# Patient Record
Sex: Female | Born: 1995 | Race: Black or African American | Hispanic: No | Marital: Married | Smoking: Never smoker
Health system: Southern US, Community
[De-identification: ages and names within clinical notes are randomized; demographics above are authoritative.]

## PROBLEM LIST (undated history)

## (undated) ENCOUNTER — Ambulatory Visit (HOSPITAL_COMMUNITY): Payer: Self-pay | Source: Home / Self Care

## (undated) DIAGNOSIS — Z789 Other specified health status: Secondary | ICD-10-CM

## (undated) DIAGNOSIS — I10 Essential (primary) hypertension: Secondary | ICD-10-CM

## (undated) DIAGNOSIS — O139 Gestational [pregnancy-induced] hypertension without significant proteinuria, unspecified trimester: Secondary | ICD-10-CM

## (undated) HISTORY — PX: LEFT OOPHORECTOMY: SHX1961

## (undated) HISTORY — DX: Essential (primary) hypertension: I10

## (undated) HISTORY — DX: Other specified health status: Z78.9

## (undated) HISTORY — DX: Gestational (pregnancy-induced) hypertension without significant proteinuria, unspecified trimester: O13.9

## (undated) HISTORY — PX: SALPINGECTOMY: SHX328

---
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
## 2015-12-10 ENCOUNTER — Emergency Department (HOSPITAL_COMMUNITY)
Admission: EM | Admit: 2015-12-10 | Discharge: 2015-12-10 | Disposition: A | Discharge status: Home / Self Care | Payer: Self-pay | Attending: Emergency Medicine | Admitting: Emergency Medicine

## 2015-12-10 ENCOUNTER — Emergency Department (HOSPITAL_COMMUNITY): Payer: Self-pay

## 2015-12-10 ENCOUNTER — Encounter (HOSPITAL_COMMUNITY): Payer: Self-pay

## 2015-12-10 DIAGNOSIS — R0789 Other chest pain: Secondary | ICD-10-CM | POA: Insufficient documentation

## 2015-12-10 IMAGING — CR DG CHEST 2V
2 series · 2 of 2 positions shown · non-contrast
Comparison: None in PACs

CLINICAL DATA: Mid 0 right-sided chart chest discomfort
intermittently for the past several months

EXAM:
CHEST  2 VIEW

[chest pa]
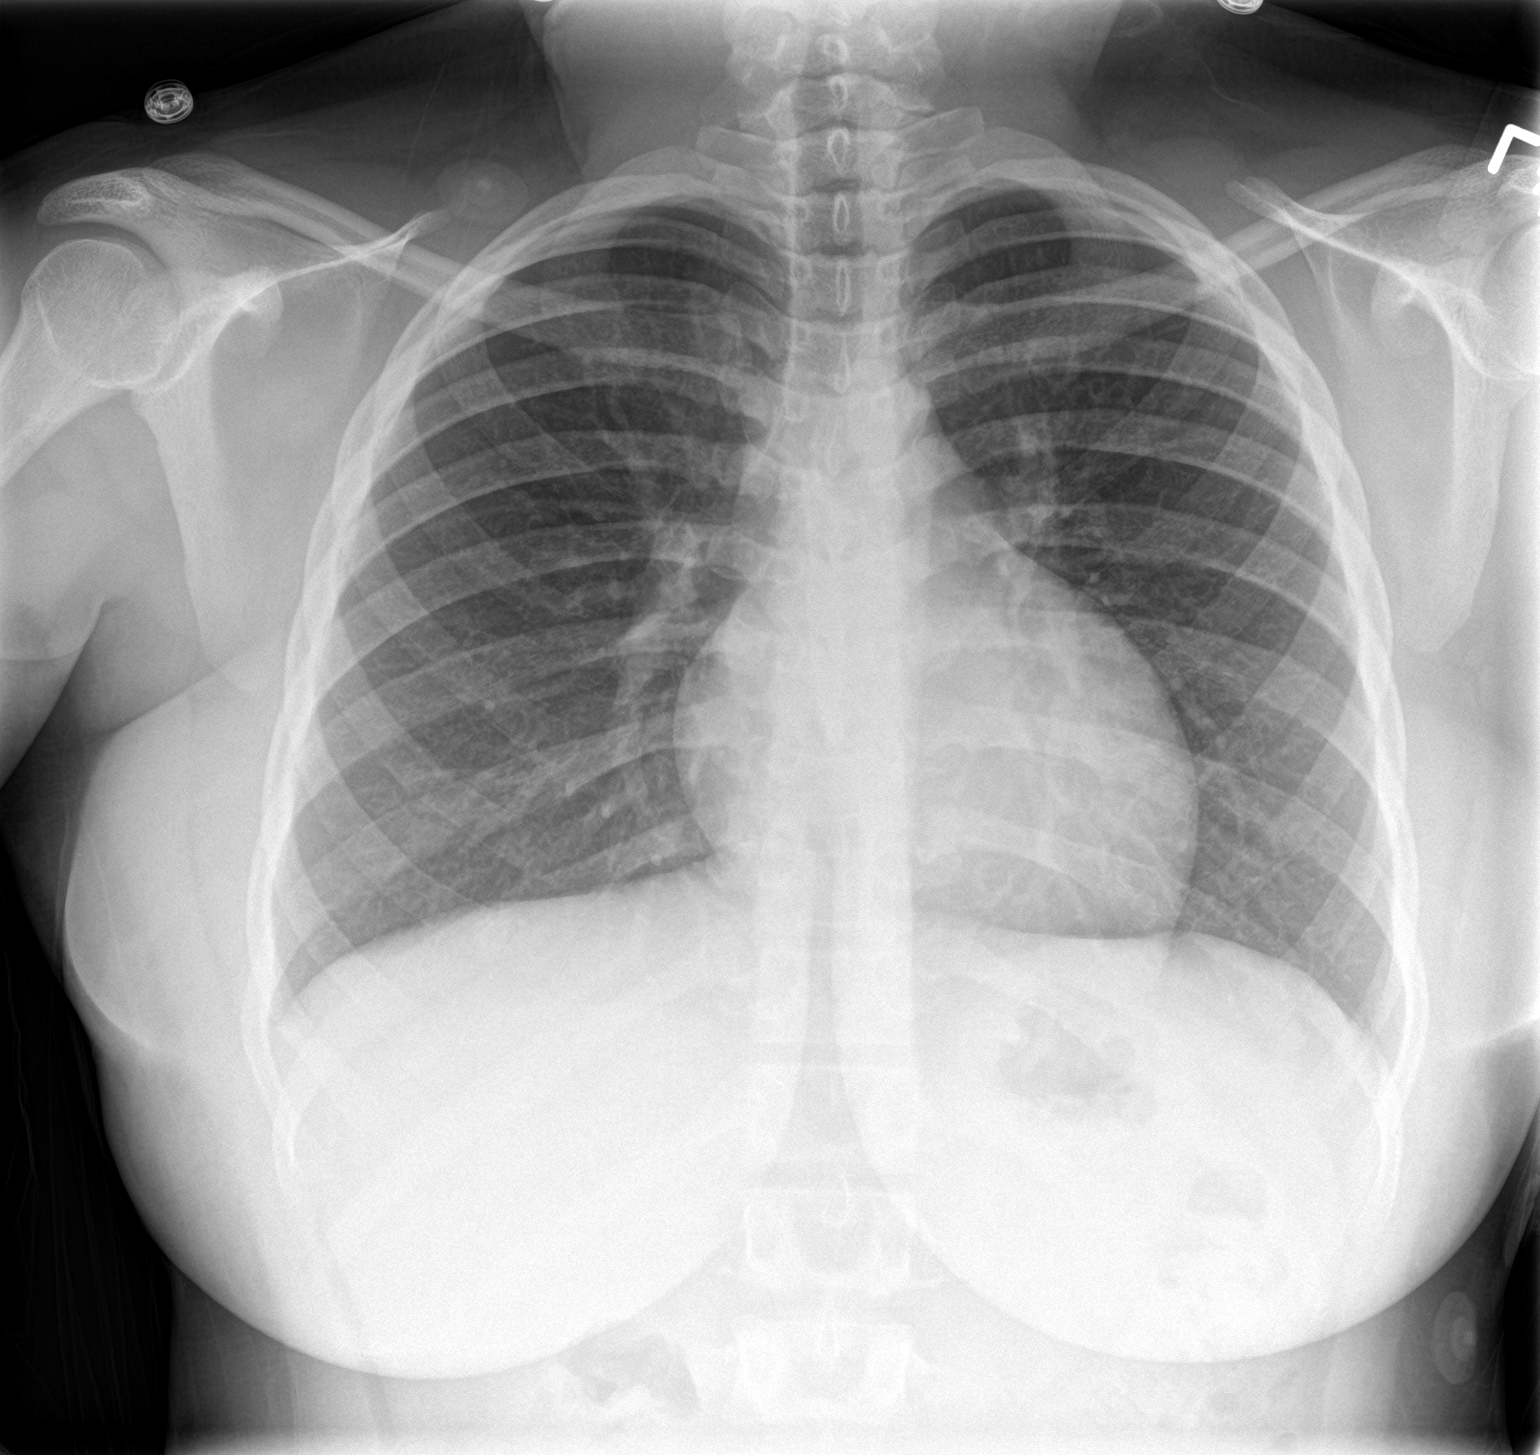

[chest lat]
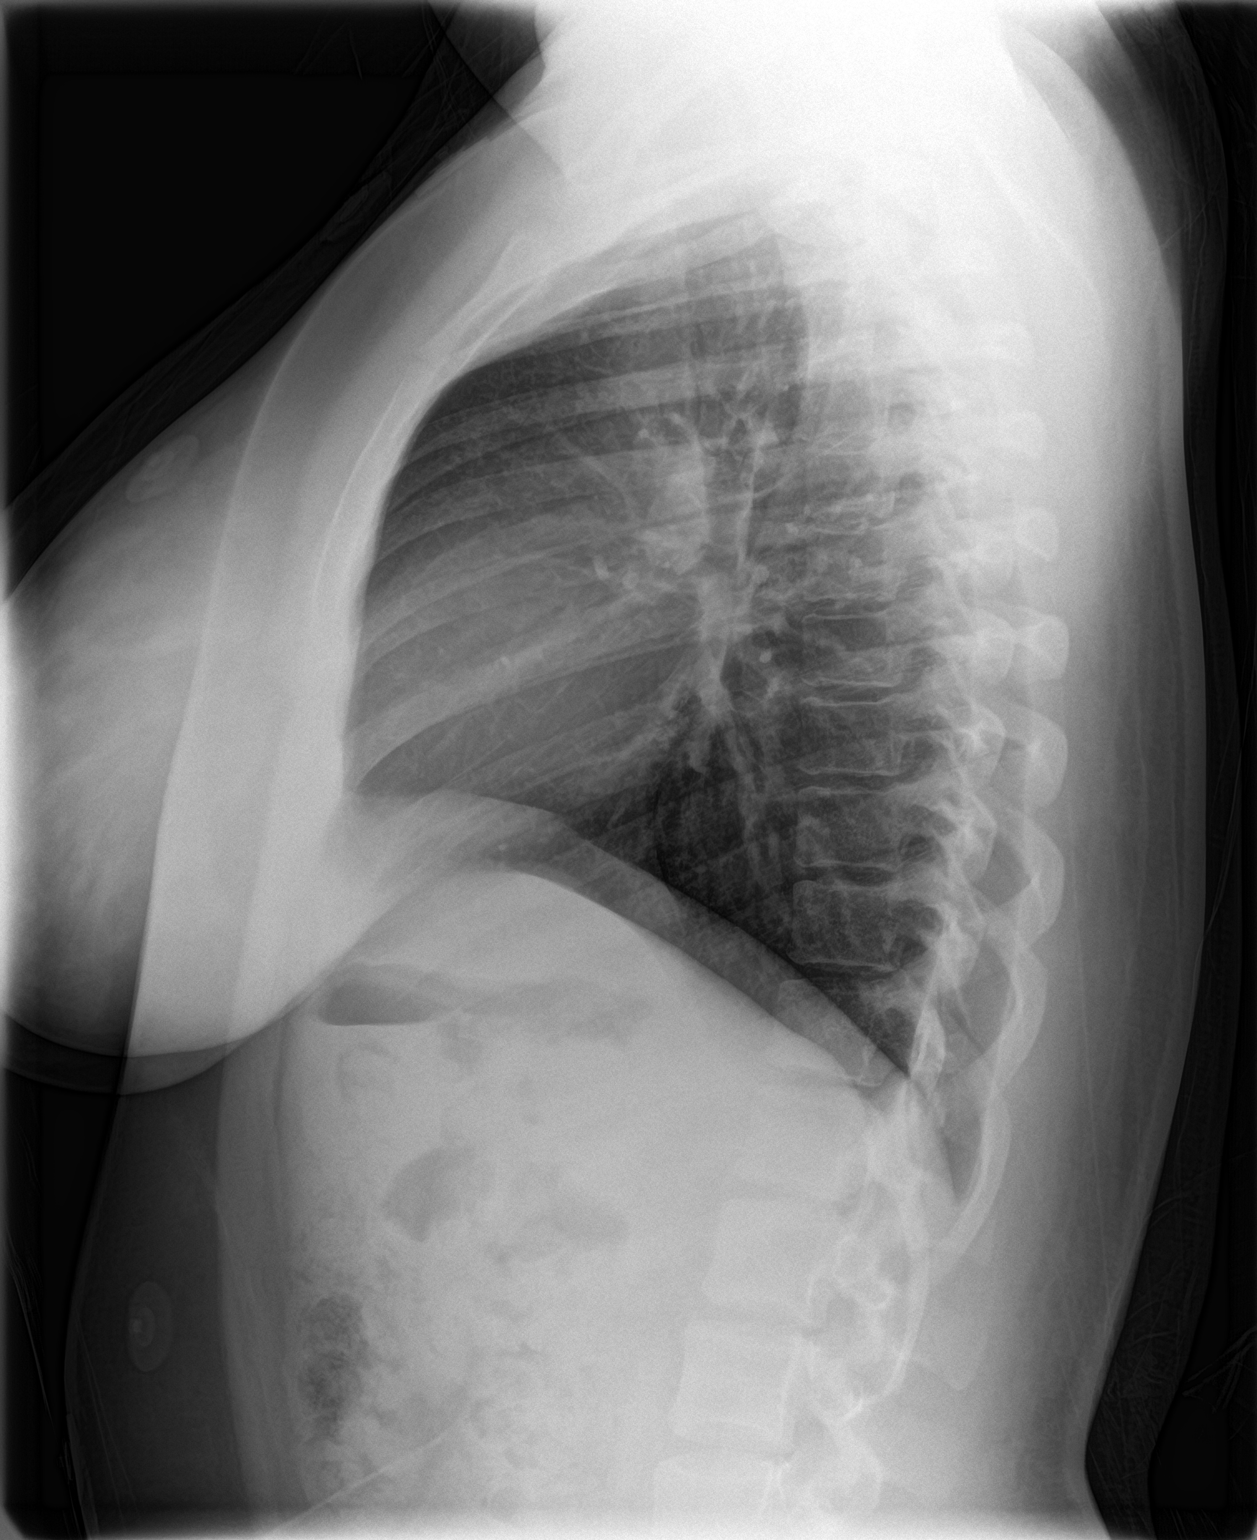

[2 of 2 positions shown; findings below may reference images not displayed]

FINDINGS: The lungs are adequately inflated and clear. The heart and pulmonary
vascularity are normal. The mediastinum is normal in width. The
trachea is midline. The bony thorax exhibits no acute abnormality.
IMPRESSION: There is no active cardiopulmonary disease. No chest wall
abnormality is observed.

## 2015-12-10 NOTE — ED Notes (Signed)
Pt. Presents with complaint of R sided CP starting " a few months ago." pt. Denies associated symptoms. Pt. States she has noticed pain worsening over past few weeks which prompted her to get evaluated. Pt. Ambulatory, in NAD. AxO x4.

## 2015-12-10 NOTE — ED Provider Notes (Signed)
CSN: [id number]     Arrival date & time 12/10/15  0907 History   First MD Initiated Contact with Patient 12/10/15 [phone]     Chief Complaint  Patient presents with  . Chest Pain     (Consider location/radiation/quality/duration/timing/severity/associated sxs/prior Treatment) HPI Comments: Patient here complaining of several months of right-sided chest pain characterized as sharp and lasting for several minutes without associated diaphoresis or dyspnea. She denies any cough or congestion. No recent weight loss. Does not take birth control pills. No leg pain or swelling. No association with food. Symptoms have been waxing and waning and are spontaneous. No associated palpitations. No history of rashes. Has not been seen by a provider for this.  Patient is a 20 y.o. female presenting with chest pain. The history is provided by the patient.  Chest Pain   History reviewed. No pertinent past medical history. History reviewed. No pertinent past surgical history. No family history on file. Social History  Substance Use Topics  . Smoking status: Never Smoker   . Smokeless tobacco: None  . Alcohol Use: No   OB History    No data available     Review of Systems  Cardiovascular: Positive for chest pain.  All other systems reviewed and are negative.     Allergies  Review of patient's allergies indicates no known allergies.  Home Medications   Prior to Admission medications   Medication Sig Start Date End Date Taking? Authorizing Provider  ibuprofen (ADVIL,MOTRIN) 200 MG tablet Take 400 mg by mouth every 6 (six) hours as needed (pain).   Yes Historical Provider, MD   BP 123/75 mmHg  Pulse 87  Temp(Src) 98.7 F (37.1 C) (Oral)  Resp 17  SpO2 98%  LMP 11/29/2015 Physical Exam  Constitutional: She is oriented to person, place, and time. She appears well-developed and well-nourished.  Non-toxic appearance. No distress.  HENT:  Head: Normocephalic and atraumatic.  Eyes:  Conjunctivae, EOM and lids are normal. Pupils are equal, round, and reactive to light.  Neck: Normal range of motion. Neck supple. No tracheal deviation present. No thyroid mass present.  Cardiovascular: Normal rate, regular rhythm and normal heart sounds.  Exam reveals no gallop.   No murmur heard. Pulmonary/Chest: Effort normal and breath sounds normal. No stridor. No respiratory distress. She has no decreased breath sounds. She has no wheezes. She has no rhonchi. She has no rales.  Abdominal: Soft. Normal appearance and bowel sounds are normal. She exhibits no distension. There is no tenderness. There is no rebound and no CVA tenderness.  Musculoskeletal: Normal range of motion. She exhibits no edema or tenderness.  Neurological: She is alert and oriented to person, place, and time. She has normal strength. No cranial nerve deficit or sensory deficit. GCS eye subscore is 4. GCS verbal subscore is 5. GCS motor subscore is 6.  Skin: Skin is warm and dry. No abrasion and no rash noted.  Psychiatric: She has a normal mood and affect. Her speech is normal and behavior is normal.  Nursing note and vitals reviewed.   ED Course  Procedures (including critical care time) Labs Review Labs Reviewed  Rosezena Sensor, ED    Imaging Review No results found. I have personally reviewed and evaluated these images and lab results as part of my medical decision-making.   EKG Interpretation   Date/Time:  Tuesday Dec 10 2015 09:18:29 EDT Ventricular Rate:  87 PR Interval:  161 QRS Duration: 78 QT Interval:  363 QTC Calculation: 437 R Axis:  40 Text Interpretation:  Sinus arrhythmia Ventricular premature complex Low  voltage, precordial leads Baseline wander in lead(s) V6 Confirmed by Lashondra Vaquerano   MD, Jayant Kriz ([id number]) on 12/10/2015 9:34:59 AM      MDM   Final diagnoses:  None    Chest x-rays reassuring. No concern here for PE or ACS. She has had symptoms for several months. Encouraged to  follow-up with her doctor.    Lorre NickAnthony Cavon Nicolls, MD 12/10/15 [phone]

## 2015-12-10 NOTE — Discharge Instructions (Signed)
Nonspecific Chest Pain  °Chest pain can be caused by many different conditions. There is always a chance that your pain could be related to something serious, such as a heart attack or a blood clot in your lungs. Chest pain can also be caused by conditions that are not life-threatening. If you have chest pain, it is very important to follow up with your health care provider. °CAUSES  °Chest pain can be caused by: °· Heartburn. °· Pneumonia or bronchitis. °· Anxiety or stress. °· Inflammation around your heart (pericarditis) or lung (pleuritis or pleurisy). °· A blood clot in your lung. °· A collapsed lung (pneumothorax). It can develop suddenly on its own (spontaneous pneumothorax) or from trauma to the chest. °· Shingles infection (varicella-zoster virus). °· Heart attack. °· Damage to the bones, muscles, and cartilage that make up your chest wall. This can include: °¨ Bruised bones due to injury. °¨ Strained muscles or cartilage due to frequent or repeated coughing or overwork. °¨ Fracture to one or more ribs. °¨ Sore cartilage due to inflammation (costochondritis). °RISK FACTORS  °Risk factors for chest pain may include: °· Activities that increase your risk for trauma or injury to your chest. °· Respiratory infections or conditions that cause frequent coughing. °· Medical conditions or overeating that can cause heartburn. °· Heart disease or family history of heart disease. °· Conditions or health behaviors that increase your risk of developing a blood clot. °· Having had chicken pox (varicella zoster). °SIGNS AND SYMPTOMS °Chest pain can feel like: °· Burning or tingling on the surface of your chest or deep in your chest. °· Crushing, pressure, aching, or squeezing pain. °· Dull or sharp pain that is worse when you move, cough, or take a deep breath. °· Pain that is also felt in your back, neck, shoulder, or arm, or pain that spreads to any of these areas. °Your chest pain may come and go, or it may stay  constant. °DIAGNOSIS °Lab tests or other studies may be needed to find the cause of your pain. Your health care provider may have you take a test called an ambulatory ECG (electrocardiogram). An ECG records your heartbeat patterns at the time the test is performed. You may also have other tests, such as: °· Transthoracic echocardiogram (TTE). During echocardiography, sound waves are used to create a picture of all of the heart structures and to look at how blood flows through your heart. °· Transesophageal echocardiogram (TEE). This is a more advanced imaging test that obtains images from inside your body. It allows your health care provider to see your heart in finer detail. °· Cardiac monitoring. This allows your health care provider to monitor your heart rate and rhythm in real time. °· Holter monitor. This is a portable device that records your heartbeat and can help to diagnose abnormal heartbeats. It allows your health care provider to track your heart activity for several days, if needed. °· Stress tests. These can be done through exercise or by taking medicine that makes your heart beat more quickly. °· Blood tests. °· Imaging tests. °TREATMENT  °Your treatment depends on what is causing your chest pain. Treatment may include: °· Medicines. These may include: °¨ Acid blockers for heartburn. °¨ Anti-inflammatory medicine. °¨ Pain medicine for inflammatory conditions. °¨ Antibiotic medicine, if an infection is present. °¨ Medicines to dissolve blood clots. °¨ Medicines to treat coronary artery disease. °· Supportive care for conditions that do not require medicines. This may include: °¨ Resting. °¨ Applying heat   or cold packs to injured areas. °¨ Limiting activities until pain decreases. °HOME CARE INSTRUCTIONS °· If you were prescribed an antibiotic medicine, finish it all even if you start to feel better. °· Avoid any activities that bring on chest pain. °· Do not use any tobacco products, including  cigarettes, chewing tobacco, or electronic cigarettes. If you need help quitting, ask your health care provider. °· Do not drink alcohol. °· Take medicines only as directed by your health care provider. °· Keep all follow-up visits as directed by your health care provider. This is important. This includes any further testing if your chest pain does not go away. °· If heartburn is the cause for your chest pain, you may be told to keep your head raised (elevated) while sleeping. This reduces the chance that acid will go from your stomach into your esophagus. °· Make lifestyle changes as directed by your health care provider. These may include: °¨ Getting regular exercise. Ask your health care provider to suggest some activities that are safe for you. °¨ Eating a heart-healthy diet. A registered dietitian can help you to learn healthy eating options. °¨ Maintaining a healthy weight. °¨ Managing diabetes, if necessary. °¨ Reducing stress. °SEEK MEDICAL CARE IF: °· Your chest pain does not go away after treatment. °· You have a rash with blisters on your chest. °· You have a fever. °SEEK IMMEDIATE MEDICAL CARE IF:  °· Your chest pain is worse. °· You have an increasing cough, or you cough up blood. °· You have severe abdominal pain. °· You have severe weakness. °· You faint. °· You have chills. °· You have sudden, unexplained chest discomfort. °· You have sudden, unexplained discomfort in your arms, back, neck, or jaw. °· You have shortness of breath at any time. °· You suddenly start to sweat, or your skin gets clammy. °· You feel nauseous or you vomit. °· You suddenly feel light-headed or dizzy. °· Your heart begins to beat quickly, or it feels like it is skipping beats. °These symptoms may represent a serious problem that is an emergency. Do not wait to see if the symptoms will go away. Get medical help right away. Call your local emergency services (911 in the [place] Do not drive yourself to the hospital. °  °This  information is not intended to replace advice given to you by your health care provider. Make sure you discuss any questions you have with your health care provider. °  °Document Released: 04/15/2005 Document Revised: 07/27/2014 Document Reviewed: 02/09/2014 °Elsevier Interactive Patient Education ©2016 [employer] ° °

## 2018-11-10 LAB — OB RESULTS CONSOLE GC/CHLAMYDIA
Chlamydia: NEGATIVE
Gonorrhea: NEGATIVE

## 2018-11-10 LAB — OB RESULTS CONSOLE RUBELLA ANTIBODY, IGM: Rubella: IMMUNE

## 2018-11-10 LAB — OB RESULTS CONSOLE RPR: RPR: NONREACTIVE

## 2018-11-10 LAB — OB RESULTS CONSOLE HEPATITIS B SURFACE ANTIGEN: Hepatitis B Surface Ag: NEGATIVE

## 2018-11-10 LAB — OB RESULTS CONSOLE HIV ANTIBODY (ROUTINE TESTING): HIV: NONREACTIVE

## 2018-11-11 ENCOUNTER — Other Ambulatory Visit (HOSPITAL_COMMUNITY): Payer: Self-pay | Admitting: Nurse Practitioner

## 2018-11-11 DIAGNOSIS — Z369 Encounter for antenatal screening, unspecified: Secondary | ICD-10-CM

## 2018-11-11 DIAGNOSIS — Z3A13 13 weeks gestation of pregnancy: Secondary | ICD-10-CM

## 2018-11-18 ENCOUNTER — Encounter (HOSPITAL_COMMUNITY): Payer: Self-pay | Admitting: *Deleted

## 2018-11-23 ENCOUNTER — Ambulatory Visit (HOSPITAL_COMMUNITY): Payer: Medicaid Other

## 2018-11-23 ENCOUNTER — Other Ambulatory Visit: Payer: Self-pay

## 2018-11-23 ENCOUNTER — Ambulatory Visit (HOSPITAL_COMMUNITY)
Admission: RE | Admit: 2018-11-23 | Discharge: 2018-11-23 | Disposition: A | Discharge status: Home / Self Care | Payer: Medicaid Other | Source: Ambulatory Visit | Attending: Obstetrics and Gynecology | Admitting: Obstetrics and Gynecology

## 2018-11-23 ENCOUNTER — Encounter (HOSPITAL_COMMUNITY): Payer: Self-pay

## 2018-11-23 ENCOUNTER — Ambulatory Visit (HOSPITAL_COMMUNITY): Payer: Medicaid Other | Admitting: *Deleted

## 2018-11-23 VITALS — Temp 99.0°F | Ht <= 58 in | Wt 158.0 lb

## 2018-11-23 DIAGNOSIS — Z3682 Encounter for antenatal screening for nuchal translucency: Secondary | ICD-10-CM | POA: Diagnosis not present

## 2018-11-23 DIAGNOSIS — Z369 Encounter for antenatal screening, unspecified: Secondary | ICD-10-CM

## 2018-11-23 DIAGNOSIS — Z3A13 13 weeks gestation of pregnancy: Secondary | ICD-10-CM | POA: Diagnosis not present

## 2018-11-23 IMAGING — US US MFM FETAL NUCHAL TRANSLUCENCY
1 series · 13 of 28 positions shown · non-contrast
Comparison: none

[Series 1: us mfm fetal nuchal translucency · 13 of 31 slices shown]
[im 2/31]
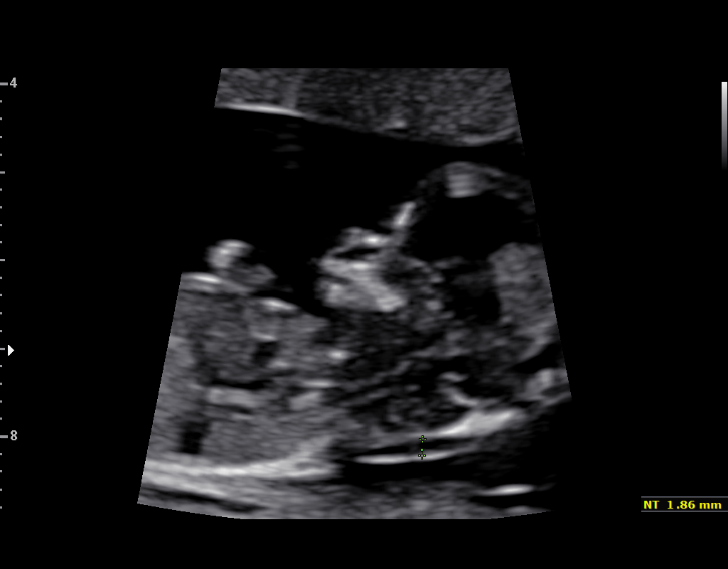
[im 4/31]
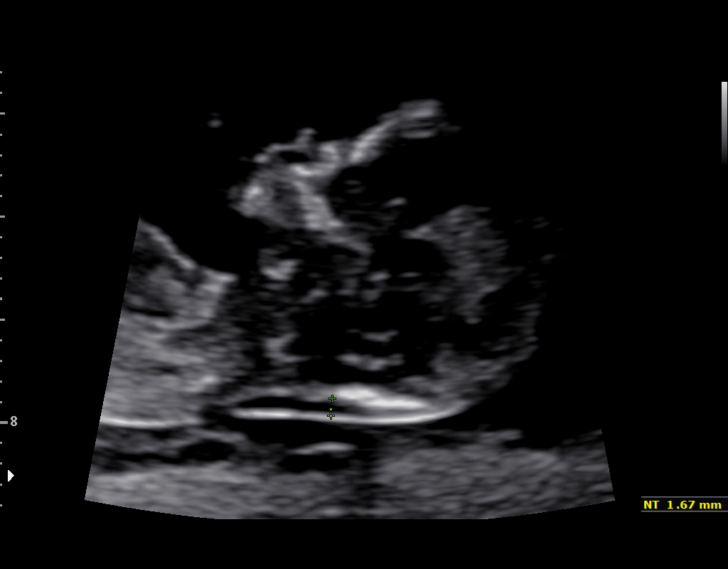
[im 6/31]
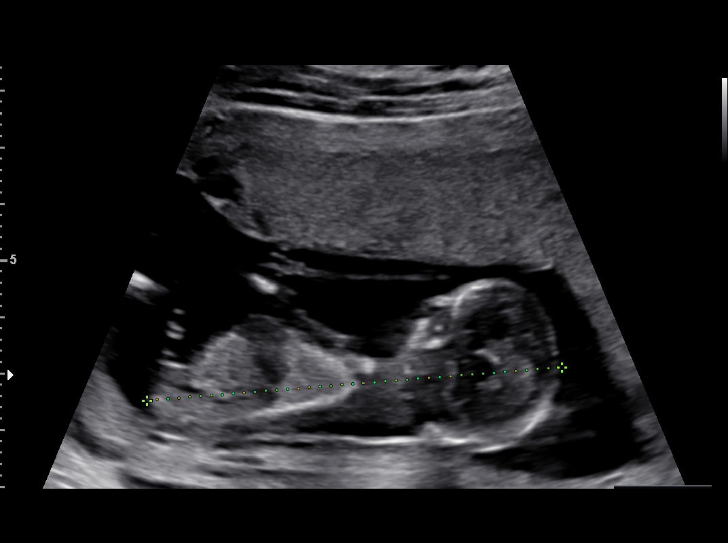
[im 8/31]
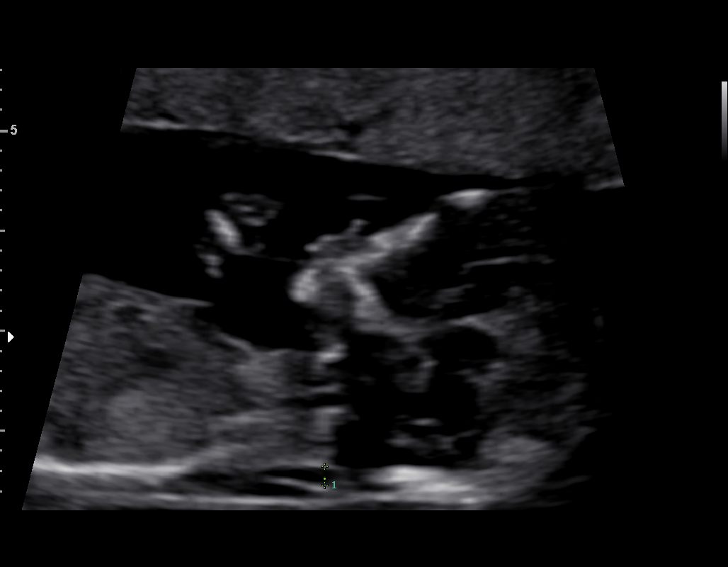
[im 11/31]
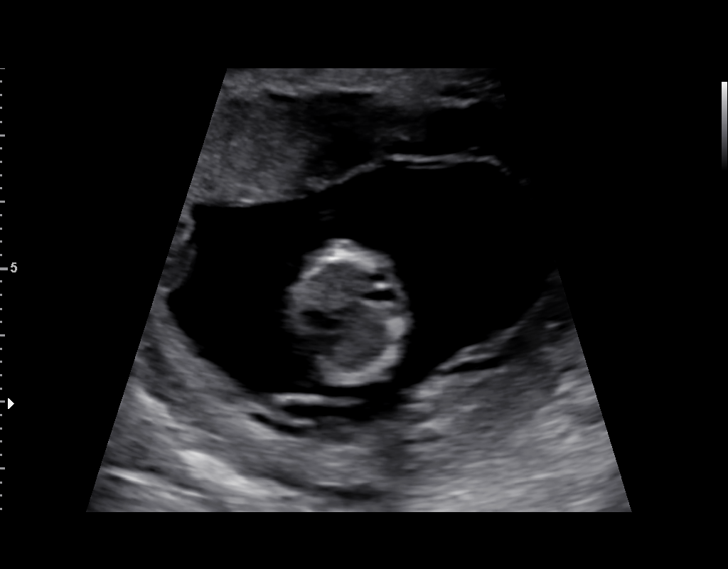
[im 13/31]
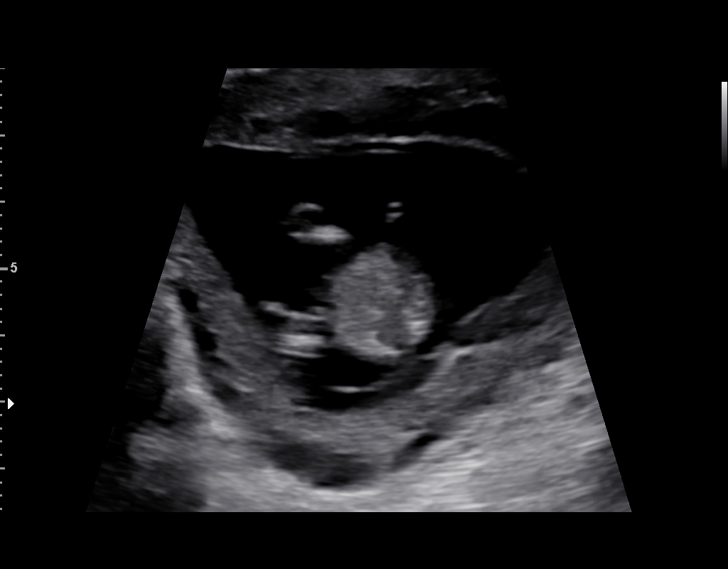
[im 16/31]
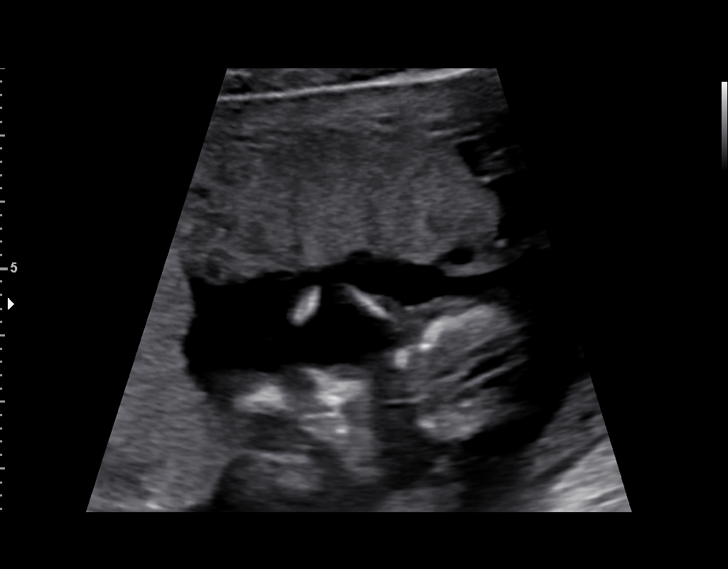
[im 18/31]
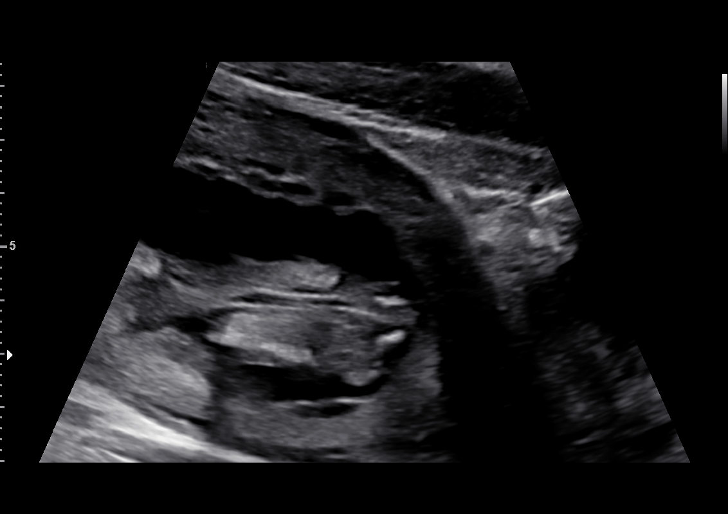
[im 21/31]
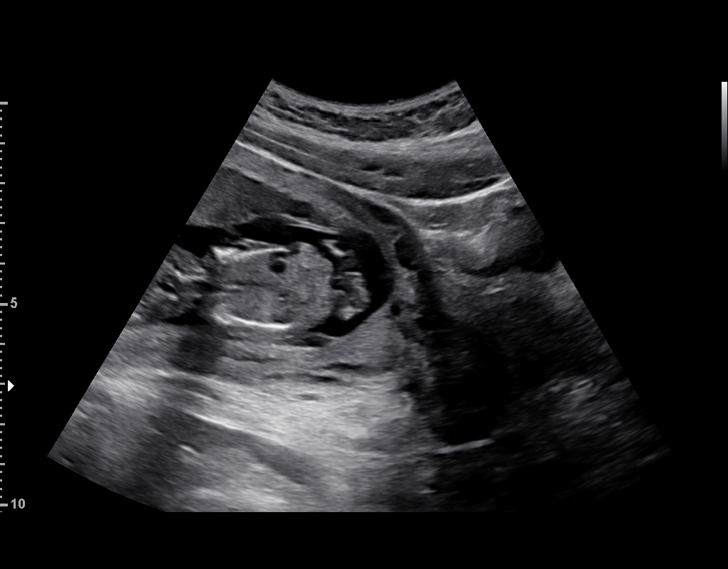
[im 23/31]
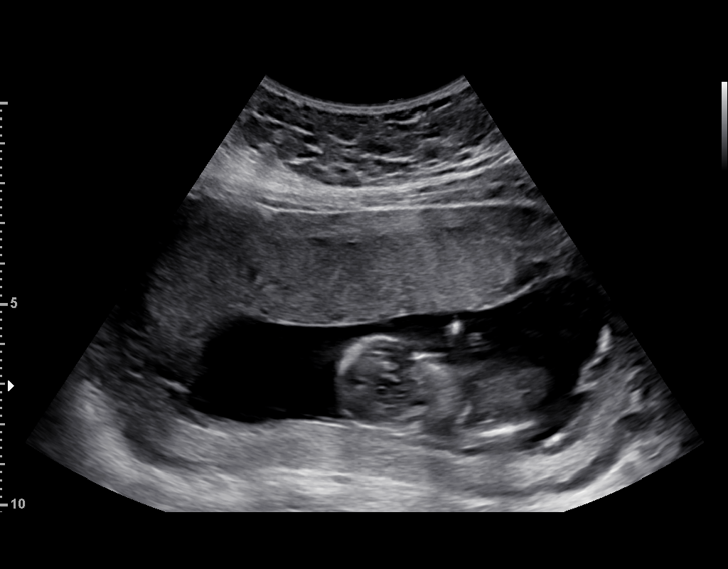
[im 25/31]
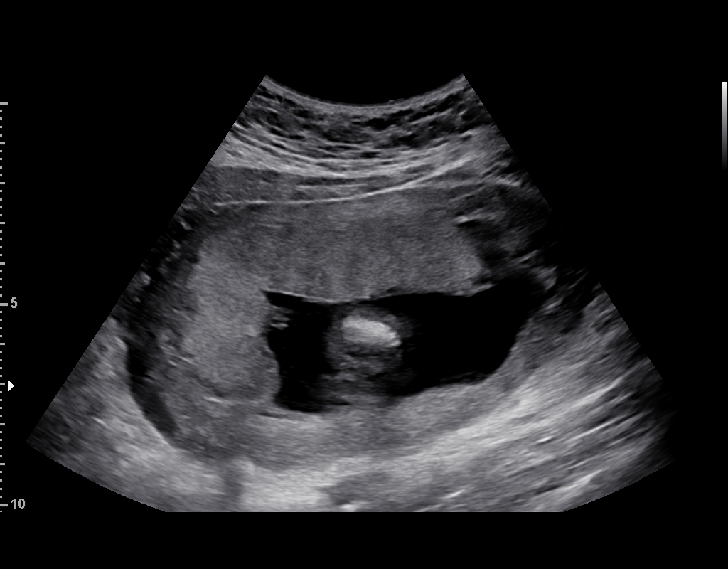
[im 27/31]
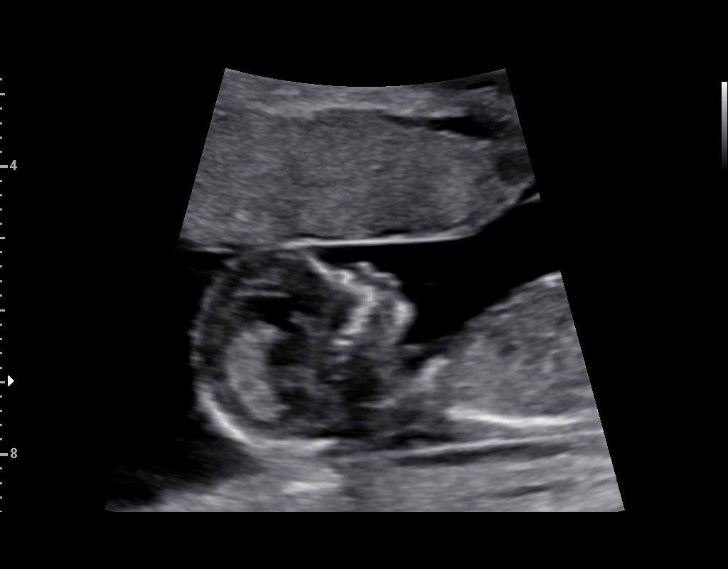
[im 29/31]
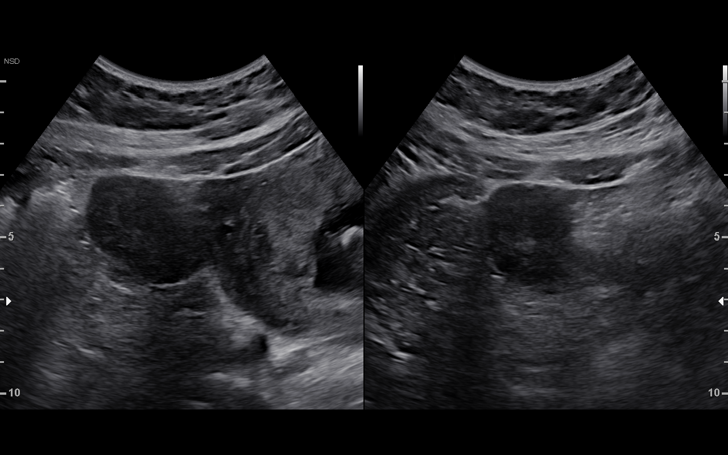

[13 of 28 positions shown; findings below may reference images not displayed]

[REDACTED]-
                                                             Faculty Physician
                   [id number]

     TRANSLUCENCY
 ----------------------------------------------------------------------

 ----------------------------------------------------------------------
Indications

  Encounter for nuchal translucency
  13 weeks gestation of pregnancy
 ----------------------------------------------------------------------
Fetal Evaluation

 Num Of Fetuses:          1
 Fetal Heart Rate(bpm):   167
 Cardiac Activity:        Observed
 Presentation:            Breech
 Placenta:                Anterior

 Amniotic Fluid
 AFI FV:      Within normal limits

                             Largest Pocket(cm)

Biometry

 CRL:      73.4  mm     G. Age:  13w 1d                  EDD:    05/30/19

 NT:        1.9  mm
OB History
 Gravidity:    1         Term:   0        Prem:   0        SAB:   0
 TOP:          0       Ectopic:  0        Living: 0
Gestational Age

 LMP:           13w 1d        Date:  08/23/18                 EDD:   05/30/19
 Best:          13w 1d     Det. By:  LMP  (08/23/18)          EDD:   05/30/19
Anatomy

 Choroid Plexus:        Appears normal         Upper Extremities:      Present
 Stomach:               Appears normal, left   Lower Extremities:      Present
                        sided
 Bladder:               Appears normal
Cervix Uterus Adnexa

 Cervix
 Length:           3.31  cm.
 Normal appearance by transabdominal scan.

 Left Ovary
 Surgically absent.

 Right Ovary
 Within normal limits.
Impression

 On ultrasound, the CRL measurement is consistent with her
 previously-established dates and good fetal heart activity is
 seen. The nuchal translucency (NT) measures
 millimeters, which is normal.  Fetal anatomy that could be
 ascertained at this gestational age is normal.

 Blood was drawn for AINHARA and beta hCG. We will
 communicate first-trimester screening results to the patient.
Recommendations

 Fetal anatomy scan at 20 weeks.
                 [place], [place]

## 2018-11-25 ENCOUNTER — Other Ambulatory Visit (HOSPITAL_COMMUNITY): Payer: Self-pay | Admitting: Nurse Practitioner

## 2018-11-30 ENCOUNTER — Telehealth (HOSPITAL_COMMUNITY): Payer: Self-pay | Admitting: *Deleted

## 2018-11-30 NOTE — Telephone Encounter (Signed)
error 

## 2019-01-09 ENCOUNTER — Encounter (HOSPITAL_COMMUNITY): Payer: Self-pay

## 2019-04-15 ENCOUNTER — Other Ambulatory Visit: Payer: Self-pay

## 2019-04-15 ENCOUNTER — Observation Stay
Admission: EM | Admit: 2019-04-15 | Discharge: 2019-04-15 | Disposition: A | Discharge status: Home / Self Care | Payer: Medicaid Other | Attending: Obstetrics and Gynecology | Admitting: Obstetrics and Gynecology

## 2019-04-15 ENCOUNTER — Encounter: Payer: Self-pay | Admitting: Emergency Medicine

## 2019-04-15 DIAGNOSIS — Z3A33 33 weeks gestation of pregnancy: Secondary | ICD-10-CM | POA: Diagnosis not present

## 2019-04-15 DIAGNOSIS — O9A213 Injury, poisoning and certain other consequences of external causes complicating pregnancy, third trimester: Secondary | ICD-10-CM | POA: Diagnosis not present

## 2019-04-15 DIAGNOSIS — S0081XA Abrasion of other part of head, initial encounter: Secondary | ICD-10-CM | POA: Diagnosis present

## 2019-04-15 LAB — TYPE AND SCREEN
ABO/RH(D): O POS
Antibody Screen: NEGATIVE

## 2019-04-15 MED ORDER — ACETAMINOPHEN 325 MG PO TABS
650.0000 mg | ORAL_TABLET | ORAL | Status: DC | PRN
  Administered 2019-04-15: 650 mg via ORAL
  Filled 2019-04-15: qty 2

## 2019-04-15 MED ORDER — CYCLOBENZAPRINE HCL 10 MG PO TABS: 10.0000 mg | ORAL_TABLET | Freq: Three times a day (TID) | ORAL | 0 refills | Status: DC | PRN
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
## 2019-05-02 LAB — OB RESULTS CONSOLE GBS: GBS: NEGATIVE

## 2019-05-22 ENCOUNTER — Other Ambulatory Visit (HOSPITAL_COMMUNITY): Payer: Self-pay | Admitting: Nurse Practitioner

## 2019-05-22 DIAGNOSIS — Z363 Encounter for antenatal screening for malformations: Secondary | ICD-10-CM

## 2019-05-22 DIAGNOSIS — O365931 Maternal care for other known or suspected poor fetal growth, third trimester, fetus 1: Secondary | ICD-10-CM

## 2019-05-23 ENCOUNTER — Inpatient Hospital Stay (HOSPITAL_COMMUNITY)
Admission: AD | Admit: 2019-05-23 | Discharge: 2019-05-28 | DRG: 786 | Disposition: A | Discharge status: Home / Self Care | Payer: Medicaid Other | Attending: Obstetrics and Gynecology | Admitting: Obstetrics and Gynecology

## 2019-05-23 ENCOUNTER — Other Ambulatory Visit (HOSPITAL_COMMUNITY): Payer: Self-pay | Admitting: Nurse Practitioner

## 2019-05-23 ENCOUNTER — Ambulatory Visit (HOSPITAL_COMMUNITY): Payer: Medicaid Other | Admitting: *Deleted

## 2019-05-23 ENCOUNTER — Encounter (HOSPITAL_COMMUNITY): Payer: Self-pay

## 2019-05-23 ENCOUNTER — Inpatient Hospital Stay (HOSPITAL_COMMUNITY): Admission: AD | Admit: 2019-05-23 | Payer: Medicaid Other | Source: Home / Self Care | Admitting: Family Medicine

## 2019-05-23 ENCOUNTER — Encounter (HOSPITAL_COMMUNITY): Payer: Self-pay | Admitting: *Deleted

## 2019-05-23 ENCOUNTER — Other Ambulatory Visit: Payer: Self-pay

## 2019-05-23 ENCOUNTER — Ambulatory Visit (HOSPITAL_COMMUNITY)
Admission: RE | Admit: 2019-05-23 | Discharge: 2019-05-23 | Disposition: A | Discharge status: Home / Self Care | Payer: Medicaid Other | Source: Ambulatory Visit | Attending: Obstetrics and Gynecology | Admitting: Obstetrics and Gynecology

## 2019-05-23 VITALS — BP 118/70 | HR 88 | Temp 98.8°F

## 2019-05-23 DIAGNOSIS — O9081 Anemia of the puerperium: Secondary | ICD-10-CM | POA: Diagnosis not present

## 2019-05-23 DIAGNOSIS — Z363 Encounter for antenatal screening for malformations: Secondary | ICD-10-CM

## 2019-05-23 DIAGNOSIS — Z3A39 39 weeks gestation of pregnancy: Secondary | ICD-10-CM

## 2019-05-23 DIAGNOSIS — O324XX0 Maternal care for high head at term, not applicable or unspecified: Secondary | ICD-10-CM | POA: Diagnosis not present

## 2019-05-23 DIAGNOSIS — O26843 Uterine size-date discrepancy, third trimester: Secondary | ICD-10-CM

## 2019-05-23 DIAGNOSIS — Z20828 Contact with and (suspected) exposure to other viral communicable diseases: Secondary | ICD-10-CM | POA: Diagnosis present

## 2019-05-23 DIAGNOSIS — O133 Gestational [pregnancy-induced] hypertension without significant proteinuria, third trimester: Secondary | ICD-10-CM

## 2019-05-23 DIAGNOSIS — O365931 Maternal care for other known or suspected poor fetal growth, third trimester, fetus 1: Secondary | ICD-10-CM | POA: Diagnosis present

## 2019-05-23 DIAGNOSIS — D62 Acute posthemorrhagic anemia: Secondary | ICD-10-CM | POA: Diagnosis not present

## 2019-05-23 DIAGNOSIS — O411230 Chorioamnionitis, third trimester, not applicable or unspecified: Secondary | ICD-10-CM | POA: Diagnosis present

## 2019-05-23 DIAGNOSIS — O134 Gestational [pregnancy-induced] hypertension without significant proteinuria, complicating childbirth: Secondary | ICD-10-CM | POA: Diagnosis present

## 2019-05-23 DIAGNOSIS — O99213 Obesity complicating pregnancy, third trimester: Secondary | ICD-10-CM

## 2019-05-23 DIAGNOSIS — O411290 Chorioamnionitis, unspecified trimester, not applicable or unspecified: Secondary | ICD-10-CM | POA: Diagnosis not present

## 2019-05-23 DIAGNOSIS — Z3490 Encounter for supervision of normal pregnancy, unspecified, unspecified trimester: Secondary | ICD-10-CM

## 2019-05-23 DIAGNOSIS — O365930 Maternal care for other known or suspected poor fetal growth, third trimester, not applicable or unspecified: Secondary | ICD-10-CM

## 2019-05-23 DIAGNOSIS — Z98891 History of uterine scar from previous surgery: Secondary | ICD-10-CM

## 2019-05-23 LAB — SARS CORONAVIRUS 2 BY RT PCR (HOSPITAL ORDER, PERFORMED IN ~~LOC~~ HOSPITAL LAB): SARS Coronavirus 2: NEGATIVE

## 2019-05-23 LAB — CBC
HCT: 31.6 % — ABNORMAL LOW (ref 36.0–46.0)
Hemoglobin: 10.2 g/dL — ABNORMAL LOW (ref 12.0–15.0)
MCH: 26.8 pg (ref 26.0–34.0)
MCHC: 32.3 g/dL (ref 30.0–36.0)
MCV: 83.2 fL (ref 80.0–100.0)
Platelets: 299 10*3/uL (ref 150–400)
RBC: 3.8 MIL/uL — ABNORMAL LOW (ref 3.87–5.11)
RDW: 13.7 % (ref 11.5–15.5)
WBC: 6.3 10*3/uL (ref 4.0–10.5)
nRBC: 0 % (ref 0.0–0.2)

## 2019-05-23 LAB — ABO/RH: ABO/RH(D): O POS

## 2019-05-23 IMAGING — US US MFM OB DETAIL+14 WK
1 series · 12 of 28 positions shown · non-contrast
Comparison: none

[Series 1: us mfm ob detail+14 wk · 62 acquisitions, 12 frames shown]
[im 3/62]
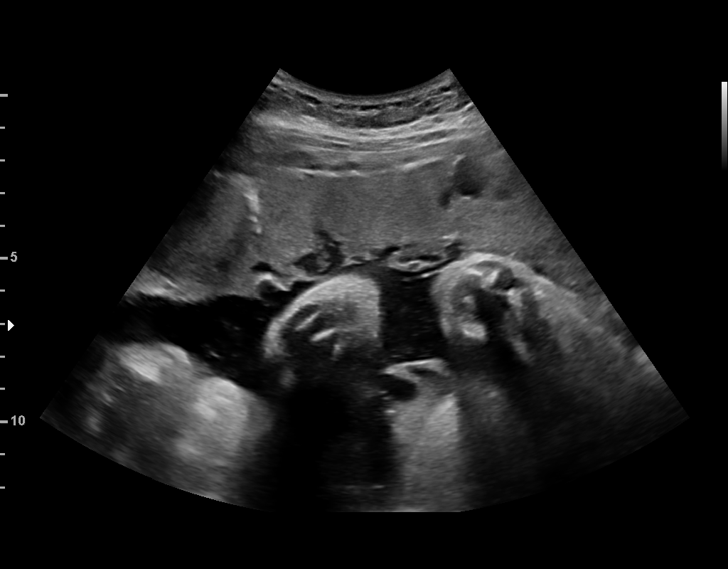
[im 7/62]
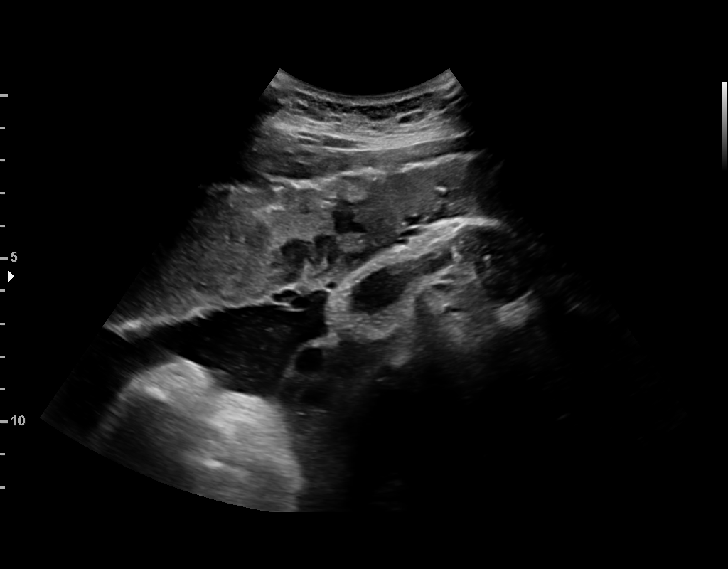
[im 12/62]
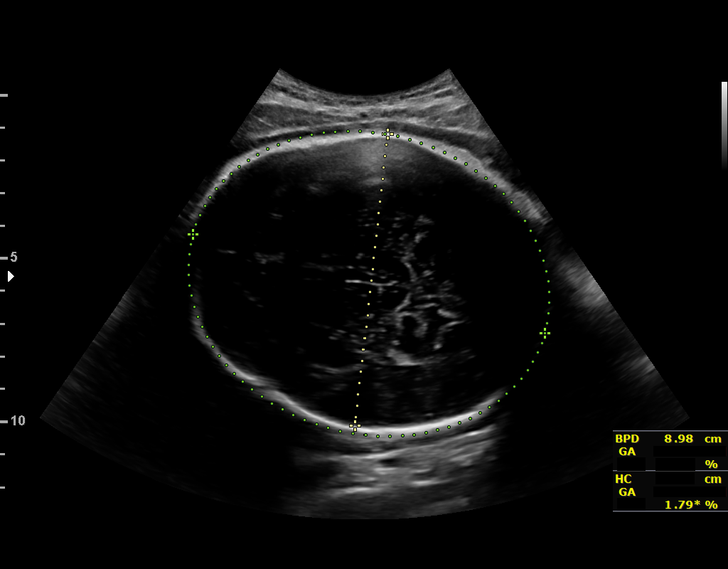
[im 19/62]
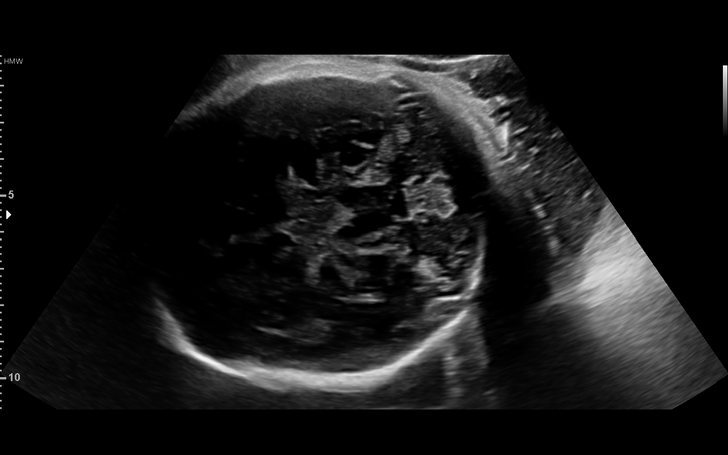
[im 23/62]
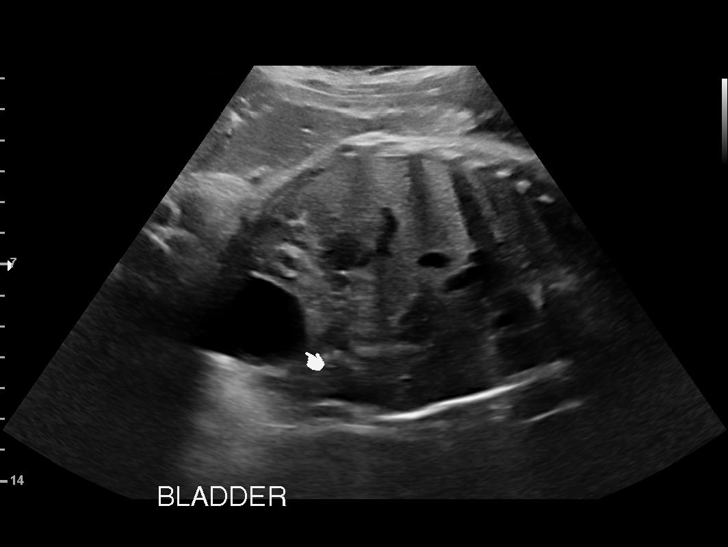
[im 28/62]
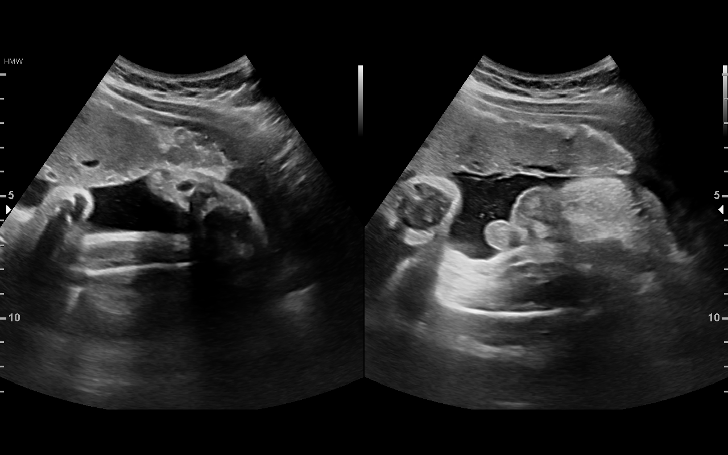
[im 34/62]
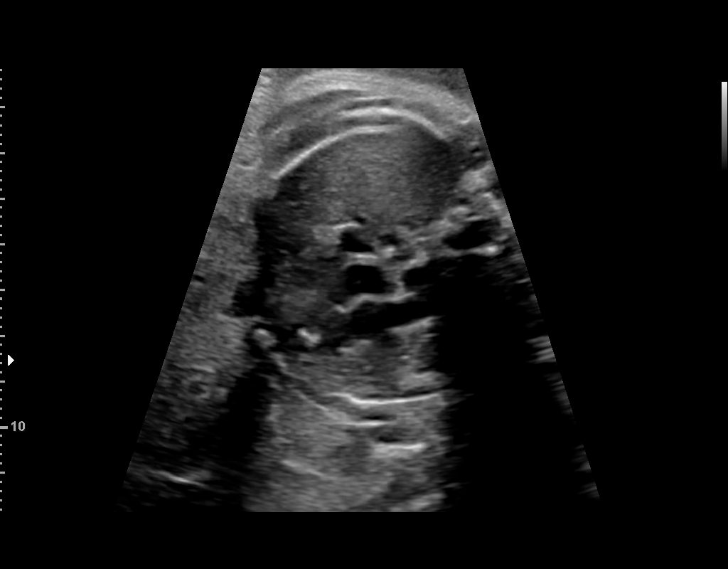
[im 39/62]
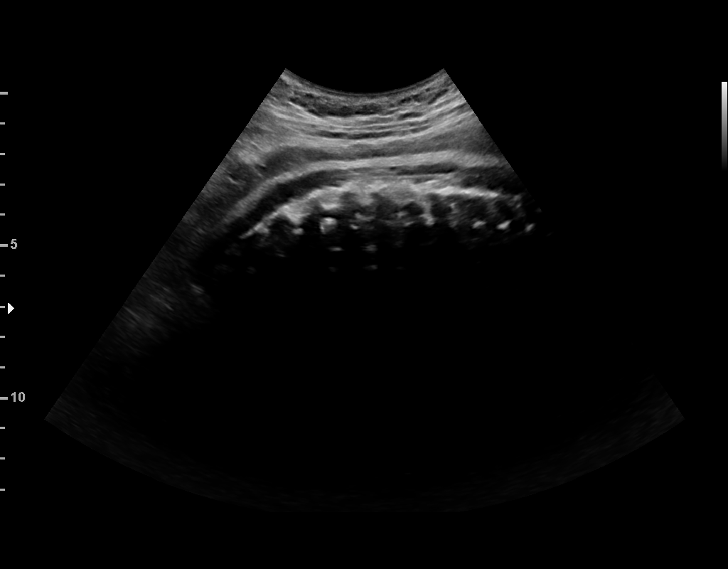
[im 43/62]
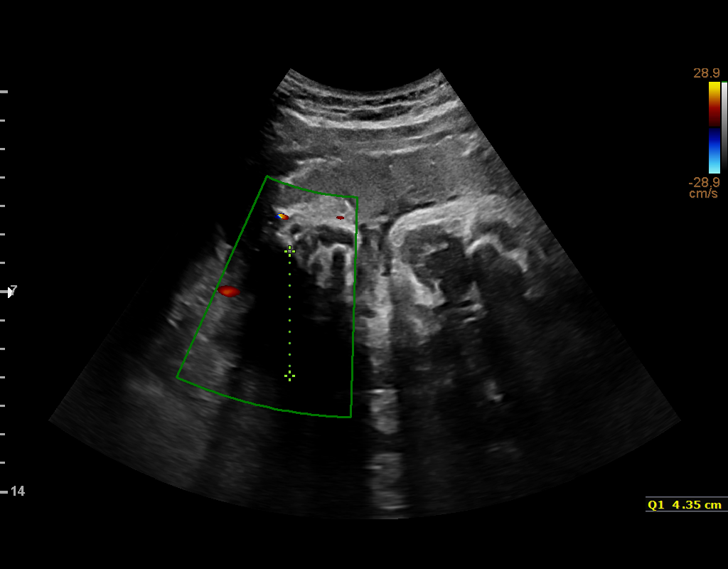
[im 50/62]
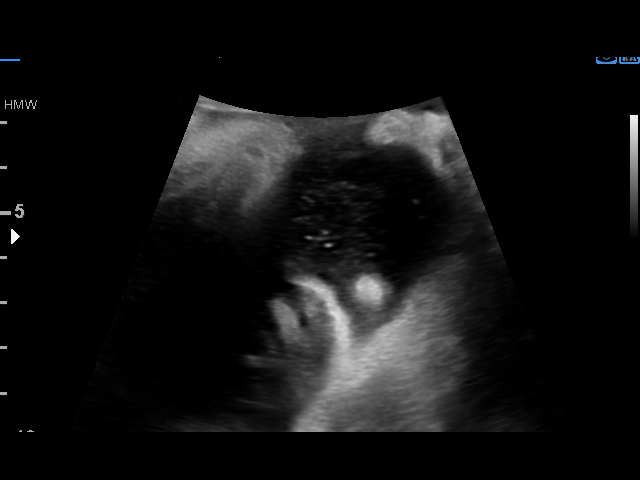
[im 55/62]
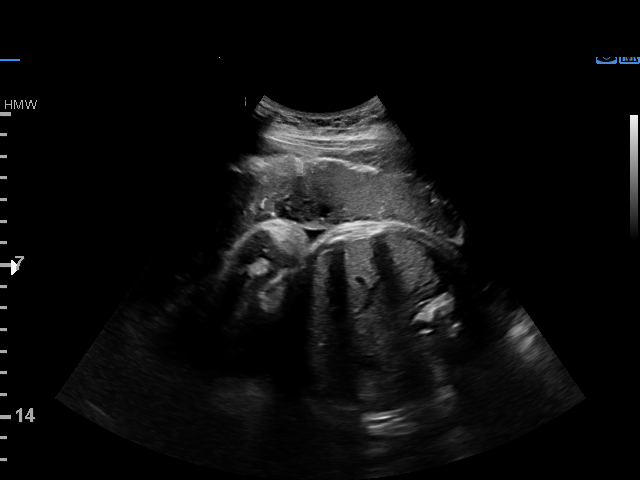
[im 59/62]
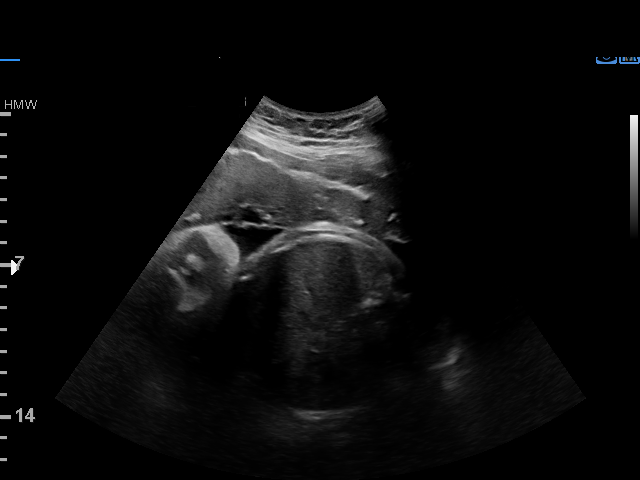

[12 of 28 positions shown; findings below may reference images not displayed]

[REDACTED]-
                                                            Faculty Physician
                   [2T]

     W/NONSTRESS
  3  US MFM UA CORD DOPPLER               76820.02     SOKAINA
 ----------------------------------------------------------------------

 ----------------------------------------------------------------------
Indications

  Maternal care for known or suspected poor      [2T]
  fetal growth, third trimester, not applicable or
  unspecified IUGR
  39 weeks gestation of pregnancy
  Encounter for antenatal screening for          [2T]
  malformations
  Uterine size-date discrepancy, third trimester [2T]
  Obesity complicating pregnancy, third          [2T]
  trimester (pregravid BMI 30.29)
 ----------------------------------------------------------------------
Fetal Evaluation

 Num Of Fetuses:         1
 Cardiac Activity:       Observed
 Presentation:           Cephalic
 Placenta:               Anterior
 P. Cord Insertion:      Visualized, central

 Amniotic Fluid
 AFI FV:      Within normal limits
 AFI Sum(cm)     %Tile       Largest Pocket(cm)
 20.5            85

 RUQ(cm)       RLQ(cm)       LUQ(cm)        LLQ(cm)

Biophysical Evaluation

 Amniotic F.V:   Within normal limits       F. Tone:        Observed
 F. Movement:    Observed                   Score:          [DATE]
 F. Breathing:   Not Observed
Biometry

 BPD:      89.9  mm     G. Age:  36w 3d         16  %    CI:           78   %    70 - 86
                                                         FL/HC:      21.6   %    20.6 -
 HC:      322.1  mm     G. Age:  36w 3d          2  %    HC/AC:      1.03        0.87 -
 AC:      313.2  mm     G. Age:  35w 2d          1  %    FL/BPD:     77.4   %    71 - 87
 FL:       69.6  mm     G. Age:  35w 5d          2  %    FL/AC:      22.2   %    20 - 24
 HUM:      60.9  mm     G. Age:  35w 2d        < 5  %
 CER:      43.1  mm     G. Age:  37w 1d         29  %

 Est. FW:    [2T]  gm           6 lb      5  %
OB History

 Gravidity:    1         Term:   0        Prem:   0        SAB:   0
 TOP:          0       Ectopic:  0        Living: 0
Gestational Age

 LMP:           39w 0d        Date:  [DATE]                 EDD:   [DATE]
 U/S Today:     36w 0d                                        EDD:   [DATE]
 Best:          39w 0d     Det. By:  LMP  ([DATE])          EDD:   [DATE]
Anatomy

 Cranium:               Appears normal         Aortic Arch:            Not well visualized
 Cavum:                 Appears normal         Ductal Arch:            Not well visualized
 Ventricles:            Appears normal         Diaphragm:              Not well visualized
 Choroid Plexus:        Appears normal         Stomach:                Appears normal, left
                                                                       sided
 Cerebellum:            Appears normal         Abdomen:                Appears normal
 Posterior Fossa:       Appears normal         Abdominal Wall:         Not well visualized
 Nuchal Fold:           Not applicable (>20    Cord Vessels:           Appears normal (3
                        wks GA)                                        vessel cord)
 Face:                  Not well visualized    Kidneys:                Not well visualized
 Lips:                  Appears normal         Bladder:                Appears normal
 Thoracic:              Appears normal         Spine:                  Not well visualized
 Heart:                 Appears normal         Upper Extremities:      Visualized
                        (4CH, axis, and
                        situs)
 RVOT:                  Appears normal         Lower Extremities:      Visualized
 LVOT:                  Not well visualized

 Other:  Fetus appears to be a male. Technically difficult due to advanced GA
         and fetal position.
Doppler - Fetal Vessels

 Umbilical Artery
  S/D     %tile     RI              PI                     ADFV    RDFV
 2.45       64   0.59             0.89                         N       N

Cervix Uterus Adnexa

 Cervix
 Not visualized (advanced GA >[2T])

 Uterus
 No abnormality visualized.

 Left Ovary
 No adnexal mass visualized.

 Right Ovary
 No adnexal mass visualized.

 Cul De Sac
 No free fluid seen.

 Adnexa
 No abnormality visualized.
Impression

 Intrauterine growth restriction EFW at the 5th%.
 Biophysical profile [DATE]
 Good fetal movement and amniotic fluid
 I discussed today's examination with Ms. SOKAINA. She reports
 good fetal movement. I recommend delivery given IUGR in
 the context of 39 week delivery.
Recommendations

 To L&D for delivery. Discussed with Dr. SOKAINA.

## 2019-05-23 MED ORDER — ONDANSETRON HCL 4 MG/2ML IJ SOLN
4.0000 mg | Freq: Four times a day (QID) | INTRAMUSCULAR | Status: DC | PRN
  Administered 2019-05-25: 4 mg via INTRAVENOUS

## 2019-05-23 MED ORDER — ACETAMINOPHEN 325 MG PO TABS
650.0000 mg | ORAL_TABLET | ORAL | Status: DC | PRN
  Administered 2019-05-24: 650 mg via ORAL
  Filled 2019-05-23: qty 2

## 2019-05-23 MED ORDER — LACTATED RINGERS IV SOLN
INTRAVENOUS | Status: DC
  Administered 2019-05-25 (×3): via INTRAVENOUS

## 2019-05-23 MED ORDER — MISOPROSTOL 50MCG HALF TABLET
50.0000 ug | ORAL_TABLET | ORAL | Status: DC
  Filled 2019-05-23: qty 1

## 2019-05-23 MED ORDER — LIDOCAINE HCL (PF) 1 % IJ SOLN: 30.0000 mL | INTRAMUSCULAR | Status: DC | PRN

## 2019-05-23 MED ORDER — MISOPROSTOL 50MCG HALF TABLET
50.0000 ug | ORAL_TABLET | ORAL | Status: DC
  Administered 2019-05-23 – 2019-05-24 (×4): 50 ug via BUCCAL
  Filled 2019-05-23 (×3): qty 1

## 2019-05-23 MED ORDER — FENTANYL CITRATE (PF) 100 MCG/2ML IJ SOLN
50.0000 ug | INTRAMUSCULAR | Status: DC | PRN
  Administered 2019-05-23: 50 ug via INTRAVENOUS
  Administered 2019-05-24: 100 ug via INTRAVENOUS
  Administered 2019-05-24: 50 ug via INTRAVENOUS
  Administered 2019-05-24 – 2019-05-25 (×3): 100 ug via INTRAVENOUS
  Filled 2019-05-23 (×5): qty 2

## 2019-05-23 MED ORDER — LACTATED RINGERS IV SOLN
500.0000 mL | INTRAVENOUS | Status: DC | PRN
  Administered 2019-05-25 (×2): 500 mL via INTRAVENOUS

## 2019-05-23 MED ORDER — OXYTOCIN 40 UNITS IN NORMAL SALINE INFUSION - SIMPLE MED
2.5000 [IU]/h | INTRAVENOUS | Status: DC
  Filled 2019-05-23: qty 1000

## 2019-05-23 MED ORDER — OXYTOCIN BOLUS FROM INFUSION: 500.0000 mL | Freq: Once | INTRAVENOUS | Status: DC

## 2019-05-23 MED ORDER — SOD CITRATE-CITRIC ACID 500-334 MG/5ML PO SOLN
30.0000 mL | ORAL | Status: DC | PRN
  Administered 2019-05-25: 30 mL via ORAL
  Filled 2019-05-23: qty 30
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
## 2019-05-23 NOTE — Procedures (Signed)
Kainat Unique Glance May 07, 1996 [redacted]w[redacted]d  Fetus A Non-Stress Test Interpretation for 05/23/19  Indication: Unsatisfactory BPP  Fetal Heart Rate A Mode: External Baseline Rate (A): 125 bpm Variability: Moderate Accelerations: 15 x 15 Decelerations: None Multiple birth?: No  Uterine Activity Mode: Toco Contraction Frequency (min): OCC UC noted Contraction Duration (sec): 80 Contraction Quality: Mild Resting Tone Palpated: Relaxed Resting Time: Adequate  Interpretation (Fetal Testing) Nonstress Test Interpretation: Reactive Comments: FHR tracing rev'd by Dr. Gertie Exon

## 2019-05-24 LAB — RAPID URINE DRUG SCREEN, HOSP PERFORMED
Amphetamines: NOT DETECTED
Barbiturates: NOT DETECTED
Benzodiazepines: NOT DETECTED
Cocaine: NOT DETECTED
Opiates: NOT DETECTED
Tetrahydrocannabinol: NOT DETECTED

## 2019-05-24 LAB — RPR: RPR Ser Ql: NONREACTIVE

## 2019-05-24 MED ORDER — BUTORPHANOL TARTRATE 1 MG/ML IJ SOLN
1.0000 mg | Freq: Once | INTRAMUSCULAR | Status: AC
  Administered 2019-05-24: 1 mg via INTRAVENOUS

## 2019-05-24 MED ORDER — DIPHENHYDRAMINE HCL 50 MG/ML IJ SOLN
12.5000 mg | INTRAMUSCULAR | Status: DC | PRN
  Filled 2019-05-24: qty 1

## 2019-05-24 MED ORDER — EPHEDRINE 5 MG/ML INJ: 10.0000 mg | INTRAVENOUS | Status: DC | PRN

## 2019-05-24 MED ORDER — LACTATED RINGERS IV SOLN
500.0000 mL | Freq: Once | INTRAVENOUS | Status: AC
  Administered 2019-05-25: 500 mL via INTRAVENOUS

## 2019-05-24 MED ORDER — MISOPROSTOL 100 MCG PO TABS
25.0000 ug | ORAL_TABLET | ORAL | Status: DC
  Administered 2019-05-24 (×2): 25 ug via VAGINAL
  Filled 2019-05-24 (×2): qty 1

## 2019-05-24 MED ORDER — PHENYLEPHRINE 40 MCG/ML (10ML) SYRINGE FOR IV PUSH (FOR BLOOD PRESSURE SUPPORT): 80.0000 ug | PREFILLED_SYRINGE | INTRAVENOUS | Status: DC | PRN

## 2019-05-24 MED ORDER — PROMETHAZINE HCL 25 MG/ML IJ SOLN
25.0000 mg | Freq: Once | INTRAMUSCULAR | Status: AC
  Administered 2019-05-24: 25 mg via INTRAVENOUS
  Filled 2019-05-24: qty 1

## 2019-05-24 MED ORDER — BUTORPHANOL TARTRATE 1 MG/ML IJ SOLN
INTRAMUSCULAR | Status: AC
  Filled 2019-05-24: qty 1

## 2019-05-24 MED ORDER — BUTORPHANOL TARTRATE 1 MG/ML IJ SOLN
1.0000 mg | Freq: Once | INTRAMUSCULAR | Status: AC
  Administered 2019-05-24: 1 mg via INTRAVENOUS
  Filled 2019-05-24: qty 1

## 2019-05-24 MED ORDER — FENTANYL-BUPIVACAINE-NACL 0.5-0.125-0.9 MG/250ML-% EP SOLN
12.0000 mL/h | EPIDURAL | Status: DC | PRN
  Filled 2019-05-24: qty 250

## 2019-05-24 NOTE — Progress Notes (Signed)
Patient ID: April Strong, female   DOB: 05/04/1996, 23 y.o.   MRN: [id number]  Patient informed that the ultrasound is considered a limited OB ultrasound and is not intended to be a complete ultrasound exam.  Patient also informed that the ultrasound is not being completed with the intent of assessing for fetal or placental anomalies or any pelvic abnormalities.  Explained that the purpose of today's ultrasound is to assess for presentation.  Baby was found to be in a cephalic presentation. Patient acknowledges the purpose of the exam and the limitations of the study.  Laury Deep, CNM  05/24/2019 4:10 PM

## 2019-05-24 NOTE — Progress Notes (Signed)
Subjective: April Strong is a 23 y.o. G1P0000 at [redacted]w[redacted]d admitted for induction of labor due to IUGR. Cytotec dose was due at 1030 and withheld per CNM order. Her for cervical exam and possible FB insertion.  Objective: BP 102/60   Pulse 85   Temp 99.1 F (37.3 C) (Oral)   Resp 18   Ht 4\' 9"  (1.448 m)   Wt 70.3 kg   LMP 08/23/2018 (Exact Date)   BMI 33.54 kg/m    FHT:  FHR: 135 bpm, variability: moderate,  accelerations:  Present,  decelerations:  Absent UC:   regular, every 2-4 minutes SVE:   Dilation: closed Effacement (%): 50 Station: -2 Exam by: Sunday Corn, CNM Cervical Balloon inserted with speculum and Ring forcep, patient tolerated procedure well  Labs: Lab Results  Component Value Date   WBC 6.3 05/23/2019   HGB 10.2 (L) 05/23/2019   HCT 31.6 (L) 05/23/2019   MCV 83.2 05/23/2019   PLT 299 05/23/2019    Assessment / Plan: Induction of labor due to IUGR  Labor: Progressing normally Preeclampsia:  n/a Fetal Wellbeing:  Category I Pain Control:  IV pain meds I/D:  n/a Anticipated MOD:  NSVD  Laury Deep, CNM 05/24/2019, 12:49 PM

## 2019-05-24 NOTE — Progress Notes (Signed)
Labor Progress Note Ashtin Unique Ziff is a 23 y.o. G1P0000 at [redacted]w[redacted]d presented for IOL due to IUGR S: Some increasing discomfort, but overall doing well.  O:  BP 114/67   Pulse 74   Temp 99.6 F (37.6 C) (Oral)   Resp 18   Ht 4\' 9"  (1.448 m)   Wt 70.3 kg   LMP 08/23/2018 (Exact Date)   BMI 33.54 kg/m  EFM: 155, reactive, moderate variability, positive accels, variable decels  CVE: Dilation: Fingertip Effacement (%): Thick Cervical Position: Anterior Station: (unable to determine-foley ballon in place ) Presentation: Vertex Exam by:: B McClam, RN    A&P: 23 y.o. G1P0000 [redacted]w[redacted]d here for IOL due to IUGR #Labor: Progressing well. S/p cytotec x6 and FB currently in place #Pain: IV pain meds #FWB: Category  #GBS negative #Anticipated MOD: VD  Marielena Harvell A Jeovani Weisenburger, Student-PA 9:06 PM
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
## 2019-05-25 ENCOUNTER — Inpatient Hospital Stay (HOSPITAL_COMMUNITY): Payer: Medicaid Other | Admitting: Anesthesiology

## 2019-05-25 ENCOUNTER — Encounter (HOSPITAL_COMMUNITY)
Admission: AD | Disposition: A | Discharge status: Home / Self Care | Payer: Self-pay | Source: Home / Self Care | Attending: Obstetrics and Gynecology

## 2019-05-25 DIAGNOSIS — O133 Gestational [pregnancy-induced] hypertension without significant proteinuria, third trimester: Secondary | ICD-10-CM

## 2019-05-25 DIAGNOSIS — O411290 Chorioamnionitis, unspecified trimester, not applicable or unspecified: Secondary | ICD-10-CM | POA: Diagnosis not present

## 2019-05-25 DIAGNOSIS — O324XX0 Maternal care for high head at term, not applicable or unspecified: Secondary | ICD-10-CM

## 2019-05-25 LAB — COMPREHENSIVE METABOLIC PANEL
ALT: 9 U/L (ref 0–44)
AST: 13 U/L — ABNORMAL LOW (ref 15–41)
Albumin: 2 g/dL — ABNORMAL LOW (ref 3.5–5.0)
Alkaline Phosphatase: 94 U/L (ref 38–126)
Anion gap: 8 (ref 5–15)
BUN: 6 mg/dL (ref 6–20)
CO2: 20 mmol/L — ABNORMAL LOW (ref 22–32)
Calcium: 8.6 mg/dL — ABNORMAL LOW (ref 8.9–10.3)
Chloride: 109 mmol/L (ref 98–111)
Creatinine, Ser: 0.79 mg/dL (ref 0.44–1.00)
GFR calc Af Amer: >60 mL/min (ref >60)
GFR calc non Af Amer: >60 mL/min (ref >60)
Glucose, Bld: 108 mg/dL — ABNORMAL HIGH (ref 70–99)
Potassium: 3.6 mmol/L (ref 3.5–5.1)
Sodium: 137 mmol/L (ref 135–145)
Total Bilirubin: 0.4 mg/dL (ref 0.3–1.2)
Total Protein: 5.8 g/dL — ABNORMAL LOW (ref 6.5–8.1)

## 2019-05-25 LAB — CBC
HCT: 30.6 % — ABNORMAL LOW (ref 36.0–46.0)
Hemoglobin: 9.9 g/dL — ABNORMAL LOW (ref 12.0–15.0)
MCH: 26.7 pg (ref 26.0–34.0)
MCHC: 32.4 g/dL (ref 30.0–36.0)
MCV: 82.5 fL (ref 80.0–100.0)
Platelets: 241 10*3/uL (ref 150–400)
RBC: 3.71 MIL/uL — ABNORMAL LOW (ref 3.87–5.11)
RDW: 13.8 % (ref 11.5–15.5)
WBC: 12.3 10*3/uL — ABNORMAL HIGH (ref 4.0–10.5)
nRBC: 0 % (ref 0.0–0.2)

## 2019-05-25 LAB — PROTEIN / CREATININE RATIO, URINE
Creatinine, Urine: 41.32 mg/dL
Total Protein, Urine: <6 mg/dL

## 2019-05-25 SURGERY — Surgical Case
Anesthesia: Epidural

## 2019-05-25 MED ORDER — MEPERIDINE HCL 25 MG/ML IJ SOLN: 6.2500 mg | INTRAMUSCULAR | Status: DC | PRN

## 2019-05-25 MED ORDER — OXYTOCIN 40 UNITS IN NORMAL SALINE INFUSION - SIMPLE MED
2.5000 [IU]/h | INTRAVENOUS | Status: AC
  Administered 2019-05-25: 2.5 [IU]/h via INTRAVENOUS

## 2019-05-25 MED ORDER — ENOXAPARIN SODIUM 40 MG/0.4ML ~~LOC~~ SOLN
40.0000 mg | SUBCUTANEOUS | Status: DC
  Administered 2019-05-26 – 2019-05-27 (×2): 40 mg via SUBCUTANEOUS
  Filled 2019-05-25 (×2): qty 0.4

## 2019-05-25 MED ORDER — NALOXONE HCL 0.4 MG/ML IJ SOLN: 0.4000 mg | INTRAMUSCULAR | Status: DC | PRN

## 2019-05-25 MED ORDER — LACTATED RINGERS IV BOLUS
1000.0000 mL | Freq: Once | INTRAVENOUS | Status: AC
  Administered 2019-05-25: 1000 mL via INTRAVENOUS

## 2019-05-25 MED ORDER — SIMETHICONE 80 MG PO CHEW
80.0000 mg | CHEWABLE_TABLET | ORAL | Status: DC
  Administered 2019-05-27 (×2): 80 mg via ORAL
  Filled 2019-05-25 (×3): qty 1

## 2019-05-25 MED ORDER — SODIUM CHLORIDE 0.9 % IV SOLN
INTRAVENOUS | Status: DC | PRN
  Administered 2019-05-25: 20:00:00 via INTRAVENOUS

## 2019-05-25 MED ORDER — KETOROLAC TROMETHAMINE 30 MG/ML IJ SOLN
30.0000 mg | Freq: Once | INTRAMUSCULAR | Status: AC | PRN
  Administered 2019-05-25: 30 mg via INTRAVENOUS

## 2019-05-25 MED ORDER — SENNOSIDES-DOCUSATE SODIUM 8.6-50 MG PO TABS
2.0000 | ORAL_TABLET | ORAL | Status: DC
  Administered 2019-05-25 – 2019-05-27 (×3): 2 via ORAL
  Filled 2019-05-25 (×3): qty 2

## 2019-05-25 MED ORDER — LACTATED RINGERS IV SOLN: INTRAVENOUS | Status: DC

## 2019-05-25 MED ORDER — SODIUM CHLORIDE 0.9% FLUSH
3.0000 mL | INTRAVENOUS | Status: DC | PRN
  Administered 2019-05-26: 3 mL via INTRAVENOUS
  Filled 2019-05-25: qty 3

## 2019-05-25 MED ORDER — ALBUMIN HUMAN 5 % IV SOLN
INTRAVENOUS | Status: DC | PRN
  Administered 2019-05-25: 21:00:00 via INTRAVENOUS

## 2019-05-25 MED ORDER — MORPHINE SULFATE (PF) 0.5 MG/ML IJ SOLN
INTRAMUSCULAR | Status: AC
  Filled 2019-05-25: qty 10

## 2019-05-25 MED ORDER — GABAPENTIN 100 MG PO CAPS
100.0000 mg | ORAL_CAPSULE | Freq: Three times a day (TID) | ORAL | Status: DC
  Administered 2019-05-26 – 2019-05-28 (×8): 100 mg via ORAL
  Filled 2019-05-25 (×8): qty 1

## 2019-05-25 MED ORDER — OXYTOCIN 40 UNITS IN NORMAL SALINE INFUSION - SIMPLE MED
INTRAVENOUS | Status: AC
  Filled 2019-05-25: qty 1000

## 2019-05-25 MED ORDER — GENTAMICIN SULFATE 40 MG/ML IJ SOLN
5.0000 mg/kg | INTRAVENOUS | Status: DC
  Administered 2019-05-25: 350 mg via INTRAVENOUS
  Filled 2019-05-25 (×2): qty 8.75

## 2019-05-25 MED ORDER — SIMETHICONE 80 MG PO CHEW
80.0000 mg | CHEWABLE_TABLET | Freq: Three times a day (TID) | ORAL | Status: DC
  Administered 2019-05-26 – 2019-05-28 (×7): 80 mg via ORAL
  Filled 2019-05-25 (×7): qty 1

## 2019-05-25 MED ORDER — SODIUM CHLORIDE 0.9 % IR SOLN
Status: DC | PRN
  Administered 2019-05-25: 1

## 2019-05-25 MED ORDER — LIDOCAINE-EPINEPHRINE (PF) 2 %-1:200000 IJ SOLN
INTRAMUSCULAR | Status: AC
  Filled 2019-05-25: qty 10

## 2019-05-25 MED ORDER — MORPHINE SULFATE (PF) 0.5 MG/ML IJ SOLN
INTRAMUSCULAR | Status: DC | PRN
  Administered 2019-05-25: 3 mg via EPIDURAL

## 2019-05-25 MED ORDER — NALBUPHINE HCL 10 MG/ML IJ SOLN
5.0000 mg | Freq: Once | INTRAMUSCULAR | Status: DC | PRN
  Filled 2019-05-25: qty 0.5

## 2019-05-25 MED ORDER — PRENATAL MULTIVITAMIN CH
1.0000 | ORAL_TABLET | Freq: Every day | ORAL | Status: DC
  Administered 2019-05-26 – 2019-05-27 (×2): 1 via ORAL
  Filled 2019-05-25 (×2): qty 1

## 2019-05-25 MED ORDER — MENTHOL 3 MG MT LOZG: 1.0000 | LOZENGE | OROMUCOSAL | Status: DC | PRN

## 2019-05-25 MED ORDER — SODIUM CHLORIDE (PF) 0.9 % IJ SOLN
INTRAMUSCULAR | Status: DC | PRN
  Administered 2019-05-25: 12 mL/h via EPIDURAL

## 2019-05-25 MED ORDER — KETOROLAC TROMETHAMINE 30 MG/ML IJ SOLN
INTRAMUSCULAR | Status: AC
  Filled 2019-05-25: qty 1

## 2019-05-25 MED ORDER — CEFAZOLIN SODIUM-DEXTROSE 2-4 GM/100ML-% IV SOLN
2.0000 g | INTRAVENOUS | Status: AC
  Administered 2019-05-25: 2 g via INTRAVENOUS
  Filled 2019-05-25: qty 100

## 2019-05-25 MED ORDER — OXYCODONE HCL 5 MG/5ML PO SOLN: 5.0000 mg | Freq: Once | ORAL | Status: DC | PRN

## 2019-05-25 MED ORDER — FENTANYL CITRATE (PF) 100 MCG/2ML IJ SOLN
INTRAMUSCULAR | Status: DC | PRN
  Administered 2019-05-25: 100 ug via EPIDURAL

## 2019-05-25 MED ORDER — TERBUTALINE SULFATE 1 MG/ML IJ SOLN: 0.2500 mg | Freq: Once | INTRAMUSCULAR | Status: DC | PRN

## 2019-05-25 MED ORDER — NALBUPHINE HCL 10 MG/ML IJ SOLN
5.0000 mg | INTRAMUSCULAR | Status: DC | PRN
  Filled 2019-05-25: qty 0.5

## 2019-05-25 MED ORDER — NALOXONE HCL 4 MG/10ML IJ SOLN
1.0000 ug/kg/h | INTRAVENOUS | Status: DC | PRN
  Filled 2019-05-25: qty 5

## 2019-05-25 MED ORDER — LIDOCAINE HCL (PF) 1 % IJ SOLN
INTRAMUSCULAR | Status: DC | PRN
  Administered 2019-05-25: 9 mL via EPIDURAL
  Administered 2019-05-25: 1 mL via EPIDURAL

## 2019-05-25 MED ORDER — SODIUM BICARBONATE 8.4 % IV SOLN
INTRAVENOUS | Status: AC
  Filled 2019-05-25: qty 50

## 2019-05-25 MED ORDER — LIDOCAINE-EPINEPHRINE (PF) 2 %-1:200000 IJ SOLN
INTRAMUSCULAR | Status: DC | PRN
  Administered 2019-05-25 (×2): 5 mL via EPIDURAL

## 2019-05-25 MED ORDER — ALBUMIN HUMAN 5 % IV SOLN
INTRAVENOUS | Status: AC
  Filled 2019-05-25: qty 250

## 2019-05-25 MED ORDER — LACTATED RINGERS IV SOLN
INTRAVENOUS | Status: DC | PRN
  Administered 2019-05-25 (×2): via INTRAVENOUS

## 2019-05-25 MED ORDER — WITCH HAZEL-GLYCERIN EX PADS: 1.0000 "application " | MEDICATED_PAD | CUTANEOUS | Status: DC | PRN

## 2019-05-25 MED ORDER — ONDANSETRON HCL 4 MG/2ML IJ SOLN: 4.0000 mg | Freq: Three times a day (TID) | INTRAMUSCULAR | Status: DC | PRN

## 2019-05-25 MED ORDER — HYDROMORPHONE HCL 1 MG/ML IJ SOLN: 0.2500 mg | INTRAMUSCULAR | Status: DC | PRN

## 2019-05-25 MED ORDER — IBUPROFEN 800 MG PO TABS
800.0000 mg | ORAL_TABLET | Freq: Three times a day (TID) | ORAL | Status: DC
  Administered 2019-05-26 – 2019-05-28 (×7): 800 mg via ORAL
  Filled 2019-05-25 (×7): qty 1

## 2019-05-25 MED ORDER — SIMETHICONE 80 MG PO CHEW
80.0000 mg | CHEWABLE_TABLET | ORAL | Status: DC | PRN
  Administered 2019-05-25: 80 mg via ORAL

## 2019-05-25 MED ORDER — OXYCODONE-ACETAMINOPHEN 5-325 MG PO TABS
1.0000 | ORAL_TABLET | ORAL | Status: DC | PRN
  Filled 2019-05-25: qty 1

## 2019-05-25 MED ORDER — SCOPOLAMINE 1 MG/3DAYS TD PT72: 1.0000 | MEDICATED_PATCH | Freq: Once | TRANSDERMAL | Status: DC

## 2019-05-25 MED ORDER — DIPHENHYDRAMINE HCL 25 MG PO CAPS: 25.0000 mg | ORAL_CAPSULE | ORAL | Status: DC | PRN

## 2019-05-25 MED ORDER — OXYTOCIN 40 UNITS IN NORMAL SALINE INFUSION - SIMPLE MED
1.0000 m[IU]/min | INTRAVENOUS | Status: DC
  Administered 2019-05-25: 2 m[IU]/min via INTRAVENOUS

## 2019-05-25 MED ORDER — OXYTOCIN 40 UNITS IN NORMAL SALINE INFUSION - SIMPLE MED
INTRAVENOUS | Status: DC | PRN
  Administered 2019-05-25: 40 [IU] via INTRAVENOUS

## 2019-05-25 MED ORDER — DIPHENHYDRAMINE HCL 25 MG PO CAPS
25.0000 mg | ORAL_CAPSULE | Freq: Four times a day (QID) | ORAL | Status: DC | PRN
  Administered 2019-05-25: 25 mg via ORAL
  Filled 2019-05-25: qty 1

## 2019-05-25 MED ORDER — ACETAMINOPHEN 500 MG PO TABS
1000.0000 mg | ORAL_TABLET | Freq: Four times a day (QID) | ORAL | Status: DC | PRN
  Administered 2019-05-25: 1000 mg via ORAL
  Filled 2019-05-25: qty 2

## 2019-05-25 MED ORDER — OXYTOCIN 40 UNITS IN NORMAL SALINE INFUSION - SIMPLE MED: 1.0000 m[IU]/min | INTRAVENOUS | Status: DC

## 2019-05-25 MED ORDER — TETANUS-DIPHTH-ACELL PERTUSSIS 5-2.5-18.5 LF-MCG/0.5 IM SUSP: 0.5000 mL | Freq: Once | INTRAMUSCULAR | Status: DC

## 2019-05-25 MED ORDER — DIBUCAINE (PERIANAL) 1 % EX OINT: 1.0000 "application " | TOPICAL_OINTMENT | CUTANEOUS | Status: DC | PRN

## 2019-05-25 MED ORDER — ONDANSETRON HCL 4 MG/2ML IJ SOLN
INTRAMUSCULAR | Status: AC
  Filled 2019-05-25: qty 2

## 2019-05-25 MED ORDER — FENTANYL CITRATE (PF) 100 MCG/2ML IJ SOLN
INTRAMUSCULAR | Status: AC
  Filled 2019-05-25: qty 2

## 2019-05-25 MED ORDER — OXYCODONE HCL 5 MG PO TABS: 5.0000 mg | ORAL_TABLET | Freq: Once | ORAL | Status: DC | PRN

## 2019-05-25 MED ORDER — SODIUM CHLORIDE 0.9 % IV SOLN
2.0000 g | Freq: Four times a day (QID) | INTRAVENOUS | Status: AC
  Administered 2019-05-25 – 2019-05-26 (×2): 2 g via INTRAVENOUS
  Filled 2019-05-25: qty 2
  Filled 2019-05-25 (×3): qty 2000

## 2019-05-25 MED ORDER — PROMETHAZINE HCL 25 MG/ML IJ SOLN: 6.2500 mg | INTRAMUSCULAR | Status: DC | PRN

## 2019-05-25 MED ORDER — DIPHENHYDRAMINE HCL 50 MG/ML IJ SOLN: 12.5000 mg | INTRAMUSCULAR | Status: DC | PRN

## 2019-05-25 MED ORDER — COCONUT OIL OIL
1.0000 "application " | TOPICAL_OIL | Status: DC | PRN
  Administered 2019-05-27: 1 via TOPICAL

## 2019-05-25 SURGICAL SUPPLY — 32 items
BENZOIN TINCTURE PRP APPL 2/3 (GAUZE/BANDAGES/DRESSINGS) ×2 IMPLANT
CHLORAPREP W/TINT 26ML (MISCELLANEOUS) ×3 IMPLANT
CLAMP CORD UMBIL (MISCELLANEOUS) IMPLANT
CLOSURE STERI STRIP 1/2 X4 (GAUZE/BANDAGES/DRESSINGS) ×2 IMPLANT
DRSG OPSITE POSTOP 4X10 (GAUZE/BANDAGES/DRESSINGS) ×3 IMPLANT
ELECT REM PT RETURN 9FT ADLT (ELECTROSURGICAL) ×3
ELECTRODE REM PT RTRN 9FT ADLT (ELECTROSURGICAL) ×1 IMPLANT
EXTRACTOR VACUUM M CUP 4 TUBE (SUCTIONS) IMPLANT
EXTRACTOR VACUUM M CUP 4' TUBE (SUCTIONS)
GAUZE SPONGE 4X4 12PLY STRL LF (GAUZE/BANDAGES/DRESSINGS) ×4 IMPLANT
GLOVE BIOGEL PI IND STRL 6.5 (GLOVE) ×1 IMPLANT
GLOVE BIOGEL PI IND STRL 7.0 (GLOVE) ×1 IMPLANT
GLOVE BIOGEL PI INDICATOR 6.5 (GLOVE) ×2
GLOVE BIOGEL PI INDICATOR 7.0 (GLOVE) ×2
GLOVE SURG SS PI 6.0 STRL IVOR (GLOVE) ×3 IMPLANT
GOWN STRL REUS W/TWL LRG LVL3 (GOWN DISPOSABLE) ×6 IMPLANT
KIT ABG SYR 3ML LUER SLIP (SYRINGE) IMPLANT
NDL HYPO 25X5/8 SAFETYGLIDE (NEEDLE) IMPLANT
NEEDLE HYPO 25X5/8 SAFETYGLIDE (NEEDLE) IMPLANT
NS IRRIG 1000ML POUR BTL (IV SOLUTION) ×3 IMPLANT
PACK C SECTION WH (CUSTOM PROCEDURE TRAY) ×3 IMPLANT
PAD ABD 7.5X8 STRL (GAUZE/BANDAGES/DRESSINGS) ×2 IMPLANT
PAD OB MATERNITY 4.3X12.25 (PERSONAL CARE ITEMS) ×3 IMPLANT
PENCIL SMOKE EVAC W/HOLSTER (ELECTROSURGICAL) ×3 IMPLANT
RTRCTR C-SECT PINK 25CM LRG (MISCELLANEOUS) IMPLANT
SEPRAFILM MEMBRANE 5X6 (MISCELLANEOUS) IMPLANT
SUT PLAIN 0 NONE (SUTURE) IMPLANT
SUT VIC AB 0 CT1 36 (SUTURE) ×14 IMPLANT
SUT VIC AB 4-0 KS 27 (SUTURE) ×3 IMPLANT
TOWEL OR 17X24 6PK STRL BLUE (TOWEL DISPOSABLE) ×3 IMPLANT
TRAY FOLEY W/BAG SLVR 14FR LF (SET/KITS/TRAYS/PACK) ×3 IMPLANT
WATER STERILE IRR 1000ML POUR (IV SOLUTION) ×3 IMPLANT

## 2019-05-25 NOTE — Progress Notes (Signed)
Patient ID: April Strong, female   DOB: 1995/12/09, 23 y.o.   MRN: [id number]  Pt resting in bed, feels some cramping; has been on Pit since approx MN  BP 132/67, P 85 FHR 150s, +accels, occ variables Ctx q 2-5 mins with Pit at 54mu/min Cx deferred (was 3-4/60/vtx -3 at start of Pit)  IUP@39 .2wks IUGR IOL process  Continue to titrate Pit to achieve active labor Anticipate vag del  Myrtis Ser CNM 05/25/2019 4:56 AM

## 2019-05-25 NOTE — Anesthesia Postprocedure Evaluation (Signed)
Anesthesia Post Note  Patient: April Strong  Procedure(s) Performed: CESAREAN SECTION (N/A )     Patient location during evaluation: Mother Baby Anesthesia Type: Epidural Level of consciousness: awake and alert Pain management: pain level controlled Vital Signs Assessment: post-procedure vital signs reviewed and stable Respiratory status: spontaneous breathing, nonlabored ventilation and respiratory function stable Cardiovascular status: stable Postop Assessment: no headache, no backache and epidural receding Anesthetic complications: no    Last Vitals:  Vitals:   05/25/19 2215 05/25/19 2245  BP: (!) 88/74 104/63  Pulse: (!) 111 (!) 110  Resp: 17 16  Temp:  37.4 C  SpO2: 96% 98%    Last Pain:  Vitals:   05/25/19 2245  TempSrc: Oral  PainSc:    Pain Goal: Patients Stated Pain Goal: 7 (05/25/19 0932)  LLE Motor Response: Purposeful movement (05/25/19 2215) LLE Sensation: Tingling (05/25/19 2215) RLE Motor Response: Purposeful movement (05/25/19 2215) RLE Sensation: Tingling (05/25/19 2215)     Epidural/Spinal Function Cutaneous sensation: (P) Tingles (05/25/19 2245), Patient able to flex knees: (P) Yes (05/25/19 2245), Patient able to lift hips off bed: (P) Yes (05/25/19 2245), Back pain beyond tenderness at insertion site: (P) No (05/25/19 2245), Progressively worsening motor and/or sensory loss: (P) No (05/25/19 2245), Bowel and/or bladder incontinence post epidural: (P) No (05/25/19 2245)  Lynda Rainwater

## 2019-05-25 NOTE — Anesthesia Preprocedure Evaluation (Signed)
Anesthesia Evaluation  Patient identified by MRN, date of birth, ID band Patient awake    Reviewed: Allergy & Precautions, NPO status , Patient's Chart, lab work & pertinent test results  Airway Mallampati: II  TM Distance: >3 FB Neck ROM: Full    Dental no notable dental hx.    Pulmonary neg pulmonary ROS,    Pulmonary exam normal breath sounds clear to auscultation       Cardiovascular negative cardio ROS Normal cardiovascular exam Rhythm:Regular Rate:Normal     Neuro/Psych negative neurological ROS  negative psych ROS   GI/Hepatic negative GI ROS, Neg liver ROS,   Endo/Other  negative endocrine ROS  Renal/GU negative Renal ROS  negative genitourinary   Musculoskeletal negative musculoskeletal ROS (+)   Abdominal   Peds negative pediatric ROS (+)  Hematology negative hematology ROS (+)   Anesthesia Other Findings   Reproductive/Obstetrics (+) Pregnancy                             Anesthesia Physical Anesthesia Plan  ASA: II and emergent  Anesthesia Plan: Epidural   Post-op Pain Management:    Induction:   PONV Risk Score and Plan: 2  Airway Management Planned: Natural Airway  Additional Equipment: None  Intra-op Plan:   Post-operative Plan:   Informed Consent: I have reviewed the patients History and Physical, chart, labs and discussed the procedure including the risks, benefits and alternatives for the proposed anesthesia with the patient or authorized representative who has indicated his/her understanding and acceptance.       Plan Discussed with:   Anesthesia Plan Comments:         Anesthesia Quick Evaluation  

## 2019-05-25 NOTE — Progress Notes (Signed)
April Strong is a 23 y.o. G1P0000 at [redacted]w[redacted]d.  Subjective: Comfortable w/ epidural.   Objective: BP 128/78   Pulse 85   Temp 98.7 F (37.1 C) (Oral)   Resp 18   Ht 4\' 9"  (1.448 m)   Wt 70.3 kg   LMP 08/23/2018 (Exact Date)   SpO2 97%   BMI 33.54 kg/m    FHT:  FHR: 145 bpm, variability: mod,  accelerations:  15x15,  decelerations:  occassional late decels that improve w/ fluids and position changes UC:   Q 2-3 minutes, strong.  MVU's adequate since placing IUPC at 1430 Dilation: 5 Effacement (%): 50 Cervical Position: Anterior Station: -2 Presentation: Vertex Exam by:: [employer]: Results for orders placed or performed during the hospital encounter of 05/23/19 (from the past 24 hour(s))  Comprehensive metabolic panel     Status: Abnormal   Collection Time: 05/25/19  1:51 PM  Result Value Ref Range   Sodium 137 135 - 145 mmol/L   Potassium 3.6 3.5 - 5.1 mmol/L   Chloride 109 98 - 111 mmol/L   CO2 20 (L) 22 - 32 mmol/L   Glucose, Bld 108 (H) 70 - 99 mg/dL   BUN 6 6 - 20 mg/dL   Creatinine, Ser 0.79 0.44 - 1.00 mg/dL   Calcium 8.6 (L) 8.9 - 10.3 mg/dL   Total Protein 5.8 (L) 6.5 - 8.1 g/dL   Albumin 2.0 (L) 3.5 - 5.0 g/dL   AST 13 (L) 15 - 41 U/L   ALT 9 0 - 44 U/L   Alkaline Phosphatase 94 38 - 126 U/L   Total Bilirubin 0.4 0.3 - 1.2 mg/dL   GFR calc non Af Amer >60 >60 mL/min   GFR calc Af Amer >60 >60 mL/min   Anion gap 8 5 - 15  CBC     Status: Abnormal   Collection Time: 05/25/19  1:51 PM  Result Value Ref Range   WBC 12.3 (H) 4.0 - 10.5 K/uL   RBC 3.71 (L) 3.87 - 5.11 MIL/uL   Hemoglobin 9.9 (L) 12.0 - 15.0 g/dL   HCT 30.6 (L) 36.0 - 46.0 %   MCV 82.5 80.0 - 100.0 fL   MCH 26.7 26.0 - 34.0 pg   MCHC 32.4 30.0 - 36.0 g/dL   RDW 13.8 11.5 - 15.5 %   Platelets 241 150 - 400 K/uL   nRBC 0.0 0.0 - 0.2 %  Protein / creatinine ratio, urine     Status: None   Collection Time: 05/25/19  2:19 PM  Result Value Ref Range   Creatinine, Urine 41.32  mg/dL   Total Protein, Urine <6 mg/dL   Protein Creatinine Ratio        0.00 - 0.15 mg/mg[Cre]    Assessment / Plan: [redacted]w[redacted]d week IUP Labor: Protracted latent phase not progressing w/ adequate contractions and AROM Fetal Wellbeing:  Category II Pain Control:  EPidural Anticipated MOD:  Likely will proceed w/ C/S for combination of arrest of dilation and decent, triple I and fetal indications. Discussed concerns w/ pt who is agreeable with whatever mode of delivery is recommended.  Dr. Elly Modena updated and will come to see pt.  D/C pitocin.   Tamala Julian, [place], [place] 05/25/2019 5:44 PM

## 2019-05-25 NOTE — Transfer of Care (Signed)
Immediate Anesthesia Transfer of Care Note  Patient: April Strong  Procedure(s) Performed: CESAREAN SECTION (N/A )  Patient Location: PACU  Anesthesia Type:Epidural  Level of Consciousness: awake  Airway & Oxygen Therapy: Patient Spontanous Breathing  Post-op Assessment: Report given to RN and Post -op Vital signs reviewed and stable  Post vital signs: stable  Last Vitals:  Vitals Value Taken Time  BP 110/52 05/25/19 2115  Temp    Pulse 122 05/25/19 2118  Resp 27 05/25/19 2118  SpO2 95 % 05/25/19 2118  Vitals shown include unvalidated device data.  Last Pain:  Vitals:   05/25/19 1830  TempSrc: Oral  PainSc:       Patients Stated Pain Goal: 7 (01/75/10 2585)  Complications: No apparent anesthesia complications
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
## 2019-05-25 NOTE — Progress Notes (Signed)
  [facility] [address] [place], [place]  [address] Phone:  [phone]  May 25, 2019     Patient: April Strong  Date of Birth:   Date of Visit: 05/23/2019                To Whom It May Concern:  Please excuse Werner Lean from work from 05/23/19-05/28/19.  Mr. Aundra Dubin was the designated support person for a patient in our care for these dates and was a vital part.  Thank you for your consideration, Bonnielee Haff, Registered Nurse  [phone]

## 2019-05-25 NOTE — Progress Notes (Signed)
Patient without cervical change with adequate contractions. Fetal status reassuring with baseline 145, mod variability, no accels, no decels. Discussed delivery via cesarean section due to failure to progress. Risks, benefits and alternatives were explained including but not limited to risks of bleeding, infection and damage to adjacent organs. Patient verbalized understanding and all questions were answered

## 2019-05-25 NOTE — Op Note (Signed)
April Strong PROCEDURE DATE: 05/23/2019 - 05/25/2019  PREOPERATIVE DIAGNOSIS: Intrauterine pregnancy at  [redacted]w[redacted]d weeks gestation; failure to progress: arrest of dilation  POSTOPERATIVE DIAGNOSIS: The same  PROCEDURE:     Cesarean Section  SURGEON:  Dr. Mora Bellman  ASSISTANT: Dr. Dione Plover  INDICATIONS: April Strong is a 23 y.o. G1P0000 at [redacted]w[redacted]d scheduled for cesarean section secondary to failure to progress: arrest of dilation.  The risks of cesarean section discussed with the patient included but were not limited to: bleeding which may require transfusion or reoperation; infection which may require antibiotics; injury to bowel, bladder, ureters or other surrounding organs; injury to the fetus; need for additional procedures including hysterectomy in the event of a life-threatening hemorrhage; placental abnormalities wth subsequent pregnancies, incisional problems, thromboembolic phenomenon and other postoperative/anesthesia complications. The patient concurred with the proposed plan, giving informed written consent for the procedure.    FINDINGS:  Viable female infant in cephalic presentation.  Apgars not available at the time of the note.  Clear amniotic fluid.  Intact placenta, three vessel cord.  Normal uterus, fallopian tubes and ovaries bilaterally.  ANESTHESIA:    Spinal INTRAVENOUS FLUIDS:1500 ml and 500 ml albumin ESTIMATED BLOOD LOSS: 779 ml URINE OUTPUT:  250 ml SPECIMENS: Placenta sent to pathology COMPLICATIONS: None immediate  PROCEDURE IN DETAIL:  The patient received intravenous antibiotics and had sequential compression devices applied to her lower extremities while in the preoperative area.  She was then taken to the operating room where anesthesia was induced and was found to be adequate. A foley catheter was placed into her bladder and attached to April Strong gravity. She was then placed in a dorsal supine position with a leftward tilt, and prepped and draped in a  sterile manner. After an adequate timeout was performed, a Pfannenstiel skin incision was made with scalpel and carried through to the underlying layer of fascia. The fascia was incised in the midline and this incision was extended bilaterally using the Mayo scissors. Kocher clamps were applied to the superior aspect of the fascial incision and the underlying rectus muscles were dissected off bluntly. A similar process was carried out on the inferior aspect of the facial incision. The rectus muscles were separated in the midline bluntly and the peritoneum was entered bluntly. The Alexis self-retaining retractor was introduced into the abdominal cavity. Attention was turned to the lower uterine segment where a bladder flap was created, and a transverse hysterotomy was made with a scalpel and extended bilaterally bluntly. The infant was successfully delivered and delayed cord clamping was performed for 1 minute. The cord was clamped and cut and infant was handed over to awaiting neonatology team. Uterine massage was then administered and the placenta delivered intact with three-vessel cord. The uterus was cleared of clot and debris.  The hysterotomy was closed with 0 Vicryl in a running locked fashion, and an imbricating layer was also placed with a 0 Vicryl. Overall, excellent hemostasis was noted. The pelvis copiously irrigated and cleared of all clot and debris. Hemostasis was confirmed on all surfaces.  The peritoneum and the muscles were reapproximated using 0 vicryl interrupted stitches. The fascia was then closed using 0 Vicryl in a running fashion.  The skin was closed in a subcuticular fashion using 3.0 Vicryl. The patient tolerated the procedure well. Sponge, lap, instrument and needle counts were correct x 2. She was taken to the recovery room in stable condition.    April Sibal ConstantMD  05/25/2019 8:48 PM

## 2019-05-25 NOTE — Progress Notes (Signed)
Patient ID: April Strong, female   DOB: 1995/12/20, 23 y.o.   MRN: [id number] April Strong is a 23 y.o. G1P0000 at [redacted]w[redacted]d.  Subjective: Moderate discomfort w/ UC's.   Objective: BP (!) 113/59   Pulse 91   Temp 98.3 F (36.8 C) (Oral)   Resp 20   Ht 4\' 9"  (1.448 m)   Wt 70.3 kg   LMP 08/23/2018 (Exact Date)   BMI 33.54 kg/m    FHT:  FHR: 140 bpm, variability: min-mod,  accelerations:  15x15,  decelerations:  Variables, few lates, resolved UC:   Q 2-3 minutes, moderate-strong Dilation: 5 Effacement (%): 60 Cervical Position: Anterior Station: -3 Presentation: Vertex Exam by:: Dr. Caron Strong  Labs: Results for orders placed or performed during the hospital encounter of 05/23/19 (from the past 24 hour(s))  Rapid urine drug screen (hospital performed)     Status: None   Collection Time: 05/24/19 11:55 AM  Result Value Ref Range   Opiates NONE DETECTED NONE DETECTED   Cocaine NONE DETECTED NONE DETECTED   Benzodiazepines NONE DETECTED NONE DETECTED   Amphetamines NONE DETECTED NONE DETECTED   Tetrahydrocannabinol NONE DETECTED NONE DETECTED   Barbiturates NONE DETECTED NONE DETECTED    Assessment / Plan: [redacted]w[redacted]d week IUP Labor: Early Fetal Wellbeing:  Category I-II, improved w/ position change  Pain Control:  Fentanyl  Anticipated MOD:  SVD  April Strong, April Strong 05/25/2019 9:15 AM

## 2019-05-25 NOTE — Anesthesia Procedure Notes (Signed)
Epidural Patient location during procedure: OB Start time: 05/25/2019 9:40 AM End time: 05/25/2019 9:51 AM  Staffing Anesthesiologist: Pervis Hocking, DO Performed: anesthesiologist   Preanesthetic Checklist Completed: patient identified, pre-op evaluation, timeout performed, IV checked, risks and benefits discussed and monitors and equipment checked  Epidural Patient position: sitting Prep: site prepped and draped and DuraPrep Patient monitoring: continuous pulse ox, blood pressure, heart rate and cardiac monitor Approach: midline Location: L3-L4 Injection technique: LOR air  Needle:  Needle type: Tuohy  Needle gauge: 17 G Needle length: 9 cm Needle insertion depth: 6.5 cm Catheter type: closed end flexible Catheter size: 19 Gauge Catheter at skin depth: 11 cm Test dose: negative  Assessment Sensory level: T8 Events: blood not aspirated, injection not painful, no injection resistance, negative IV test and no paresthesia  Additional Notes Patient identified. Risks/Benefits/Options discussed with patient including but not limited to bleeding, infection, nerve damage, paralysis, failed block, incomplete pain control, headache, blood pressure changes, nausea, vomiting, reactions to medication both or allergic, itching and postpartum back pain. Confirmed with bedside nurse the patient's most recent platelet count. Confirmed with patient that they are not currently taking any anticoagulation, have any bleeding history or any family history of bleeding disorders. Patient expressed understanding and wished to proceed. All questions were answered. Sterile technique was used throughout the entire procedure. Please see nursing notes for vital signs. Test dose was given through epidural catheter and negative prior to continuing to dose epidural or start infusion. Warning signs of high block given to the patient including shortness of breath, tingling/numbness in hands, complete motor  block, or any concerning symptoms with instructions to call for help. Patient was given instructions on fall risk and not to get out of bed. All questions and concerns addressed with instructions to call with any issues or inadequate analgesia.  Reason for block:procedure for pain

## 2019-05-25 NOTE — Discharge Summary (Addendum)
Postpartum Discharge Summary  Patient Name: April Strong DOB: 07/06/1996 MRN: [id number]  Date of admission: 05/23/2019 Delivering Provider: Clarnce Flock   Date of discharge: 05/27/2019  Admitting diagnosis: INDUCTION Intrauterine pregnancy: [redacted]w[redacted]d    Secondary diagnosis:  Principal Problem:   Encounter for induction of labor Active Problems:   IUGR (intrauterine growth restriction) affecting care of mother, third trimester, fetus 1   Chorioamnionitis   Gestational hypertension w/o significant proteinuria in 3rd trimester  Additional problems: N/A     Discharge diagnosis: Term Pregnancy Delivered and Gestational Hypertension                                                                                                Post partum procedures:blood transfusion  Augmentation: AROM, Pitocin, Cytotec and Foley Balloon  Complications: Intrauterine Inflammation or infection (Chorioamniotis)  Hospital course:  Induction of Labor With Cesarean Section  23y.o. yo G1P0000 at 36w2das admitted to the hospital 05/23/2019 for induction of labor. Patient had a labor course significant for: long induction for almost 48h, started with misoprostol x6 and then foley bulb. Subsequently had pitocin and AROM. Reached 5cm dilation at 0800 on 05/25/2019 and remained at 5cm for approximately 10 hours, at which point diagnosed with arrest of dilation.   The patient went for cesarean section due to Arrest of Dilation, and delivered a Viable infant,05/25/2019  Membrane Rupture Time/Date: 2:44 PM ,05/25/2019   Details of operation can be found in separate operative Note.  Patient had an uncomplicated postpartum course. She is ambulating, tolerating a regular diet, passing flatus, and urinating well.  Patient is discharged home in stable condition on 05/27/19.                                   Delivery time: 8:22 PM    Magnesium Sulfate received: No BMZ received:  No Rhophylac:N/A MMR:N/A Transfusion:Yes  Physical exam  Vitals:   05/26/19 1424 05/26/19 1605 05/26/19 2158 05/27/19 0508  BP: 121/79 (!) 108/94 122/78 117/87  Pulse: 90 90 80 74  Resp: 16 16 18 18   Temp: 98.1 F (36.7 C) 98 F (36.7 C) 98.4 F (36.9 C) 98.5 F (36.9 C)  TempSrc: Oral Oral Oral Oral  SpO2: 99% 98% 100% 98%  Weight:      Height:       General: alert, cooperative and no distress Lochia: appropriate Uterine Fundus: firm Incision: Healing well with no significant drainage, No significant erythema, Dressing is clean, dry, and intact DVT Evaluation: No evidence of DVT seen on physical exam. Labs: Lab Results  Component Value Date   WBC 11.3 (H) 05/26/2019   HGB 8.3 (L) 05/26/2019   HCT 24.9 (L) 05/26/2019   MCV 84.7 05/26/2019   PLT 175 05/26/2019   CMP Latest Ref Rng & Units 05/26/2019  Glucose 70 - 99 mg/dL -  BUN 6 - 20 mg/dL -  Creatinine 0.44 - 1.00 mg/dL 0.87  Sodium 135 - 145 mmol/L -  Potassium 3.5 - 5.1 mmol/L -  Chloride 98 - 111 mmol/L -  CO2 22 - 32 mmol/L -  Calcium 8.9 - 10.3 mg/dL -  Total Protein 6.5 - 8.1 g/dL -  Total Bilirubin 0.3 - 1.2 mg/dL -  Alkaline Phos 38 - 126 U/L -  AST 15 - 41 U/L -  ALT 0 - 44 U/L -    Discharge instruction: per After Visit Summary and "Baby and Me Booklet".  After visit meds:  Allergies as of 05/27/2019   No Known Allergies     Medication List    STOP taking these medications   calcium carbonate 500 MG chewable tablet Commonly known as: TUMS - dosed in mg elemental calcium   cyclobenzaprine 10 MG tablet Commonly known as: FLEXERIL   nitrofurantoin (macrocrystal-monohydrate) 100 MG capsule Commonly known as: MACROBID   Vitafol Gummies 3.33-0.333-34.8 MG Chew     TAKE these medications   acetaminophen 500 MG tablet Commonly known as: TYLENOL Take 2 tablets (1,000 mg total) by mouth every 6 (six) hours as needed for fever or headache.   ibuprofen 800 MG tablet Commonly known as:  ADVIL Take 1 tablet (800 mg total) by mouth every 8 (eight) hours.   oxyCODONE 5 MG immediate release tablet Commonly known as: Roxicodone Take 1 tablet (5 mg total) by mouth every 6 (six) hours as needed for up to 20 doses for moderate pain, severe pain or breakthrough pain.   PRENATAL VITAMINS PO Take by mouth.       Diet: routine diet  Activity: Advance as tolerated. Pelvic rest for 6 weeks.   Outpatient follow up:6 weeks Follow up Appt:No future appointments. Follow up Visit:   Please schedule this patient for Postpartum visit in: 6 weeks with the following provider: Any provider For C/S patients schedule nurse incision check in weeks 2 weeks: yes High risk pregnancy complicated by: intrapartum gestational HTN, IUGR Delivery mode:  CS Anticipated Birth Control:  OCPs PP Procedures needed: BP check, incision check  Schedule Integrated BH visit: no      Newborn Data: Live born female  Birth Weight:  2615 g APGAR: 15, 9  Newborn Delivery   Birth date/time: 05/25/2019 20:22:00 Delivery type: Vaginal, Spontaneous      Baby Feeding: Breast Disposition:home with mother   05/27/2019 Gifford Shave, MD   I confirm that I have verified the information documented in the resident's note and that I have also personally reperformed the history, physical exam and all medical decision making activities of this service and have verified that all service and findings are accurately documented in this student's note except that pt desires to stay, and infant is to stay until tomorrow so discharge order placed.  Continue current care until tomorrow.  Elvera Maria, CNM 05/27/2019 10:07 AM

## 2019-05-25 NOTE — Progress Notes (Signed)
ANTIBIOTIC CONSULT NOTE - INITIAL  Pharmacy Consult for Gentamicin Indication: Chorioamnionitis   No Known Allergies  Patient Measurements: Height: 4\' 9"  (144.8 cm) Weight: 155 lb (70.3 kg) IBW/kg (Calculated) : 38.6  Vital Signs: Temp: 101.1 F (38.4 C) (11/05 1440) Temp Source: Oral (11/05 1440) BP: 124/95 (11/05 1430) Pulse Rate: 100 (11/05 1430)  Labs: Recent Labs    05/23/19 1712 05/25/19 1351  WBC 6.3 12.3*  HGB 10.2* 9.9*  PLT 299 241  CREATININE  --  0.79   No results for input(s): GENTTROUGH, GENTPEAK, GENTRANDOM in the last 72 hours.   Microbiology: Recent Results (from the past 720 hour(s))  OB RESULT CONSOLE Group B Strep     Status: None   Collection Time: 05/02/19 12:00 AM  Result Value Ref Range Status   GBS Negative  Final  SARS Coronavirus 2 by RT PCR (hospital order, performed in [facility] hospital lab) Nasopharyngeal Nasopharyngeal Swab     Status: None   Collection Time: 05/23/19  5:17 PM   Specimen: Nasopharyngeal Swab  Result Value Ref Range Status   SARS Coronavirus 2 NEGATIVE NEGATIVE Final    Comment: (NOTE) If result is NEGATIVE SARS-CoV-2 target nucleic acids are NOT DETECTED. The SARS-CoV-2 RNA is generally detectable in upper and lower  respiratory specimens during the acute phase of infection. The lowest  concentration of SARS-CoV-2 viral copies this assay can detect is 250  copies / mL. A negative result does not preclude SARS-CoV-2 infection  and should not be used as the sole basis for treatment or other  patient management decisions.  A negative result may occur with  improper specimen collection / handling, submission of specimen other  than nasopharyngeal swab, presence of viral mutation(s) within the  areas targeted by this assay, and inadequate number of viral copies  (<250 copies / mL). A negative result must be combined with clinical  observations, patient history, and epidemiological information. If result is  POSITIVE SARS-CoV-2 target nucleic acids are DETECTED. The SARS-CoV-2 RNA is generally detectable in upper and lower  respiratory specimens dur ing the acute phase of infection.  Positive  results are indicative of active infection with SARS-CoV-2.  Clinical  correlation with patient history and other diagnostic information is  necessary to determine patient infection status.  Positive results do  not rule out bacterial infection or co-infection with other viruses. If result is PRESUMPTIVE POSTIVE SARS-CoV-2 nucleic acids MAY BE PRESENT.   A presumptive positive result was obtained on the submitted specimen  and confirmed on repeat testing.  While 2019 novel coronavirus  (SARS-CoV-2) nucleic acids may be present in the submitted sample  additional confirmatory testing may be necessary for epidemiological  and / or clinical management purposes  to differentiate between  SARS-CoV-2 and other Sarbecovirus currently known to infect humans.  If clinically indicated additional testing with an alternate test  methodology [phone]) is advised. The SARS-CoV-2 RNA is generally  detectable in upper and lower respiratory sp ecimens during the acute  phase of infection. The expected result is Negative. Fact Sheet for Patients:  (WUJ8119 Fact Sheet for Healthcare Providers: BoilerBrush.com.cy This test is not yet approved or cleared by the https://pope.com/ FDA and has been authorized for detection and/or diagnosis of SARS-CoV-2 by FDA under an Emergency Use Authorization (EUA).  This EUA will remain in effect (meaning this test can be used) for the duration of the COVID-19 declaration under Section 564(b)(1) of the Act, 21 U.S.C. section 360bbb-3(b)(1), unless the authorization  is terminated or revoked sooner. Performed at [facility], [address] [place], [place] [address]     Medications:  Ampicillin 2gm iv q6hr   Goal of  Therapy:  Gentamicin peak 20-25 mg/L and Trough < 1 mg/L  Plan:  Gentamicin 350 mg (5mg /kg) IV every 24 hrs  Check Scr with next labs if gentamicin continued. Will check gentamicin levels if continued > 72hr or clinically indicated.  Wyline Mood 05/25/2019,3:06 PM

## 2019-05-25 NOTE — Progress Notes (Signed)
Patient ID: Lenord Fellers, female   DOB: Jun 19, 1996, 23 y.o.   MRN: [id number] Resha Filippone is a 23 y.o. G1P0000 at [redacted]w[redacted]d.  Subjective: Pain controlled w/ epidural. Reports small amount of liking fluid since middle of the night.  Denies HA, vision changes or epigastric pain.  Objective: BP (!) 124/95   Pulse 100   Temp (!) 101.1 F (38.4 C) (Oral)   Resp 18   Ht 4\' 9"  (1.448 m)   Wt 70.3 kg   LMP 08/23/2018 (Exact Date)   SpO2 97%   BMI 33.54 kg/m   Patient Vitals for the past 24 hrs:  BP Temp Temp src Pulse Resp SpO2  05/25/19 1440 - (!) 101.1 F (38.4 C) Oral - - -  05/25/19 1430 (!) 124/95 - - 100 18 -  05/25/19 1400 138/79 - - 93 18 -  05/25/19 1330 140/78 - - 95 18 -  05/25/19 1324 - 98.5 F (36.9 C) Oral - - -  05/25/19 1300 (!) 144/84 - - 81 16 -  05/25/19 1230 (!) 148/90 - - 81 18 -  05/25/19 1200 137/90 99.5 F (37.5 C) Oral 99 18 -  05/25/19 1130 139/88 - - 87 16 -  05/25/19 1100 126/76 - - (!) 102 16 -  05/25/19 1030 106/76 - - 79 16 -  05/25/19 1025 140/85 - - 81 16 97 %  05/25/19 1020 137/80 - - 80 16 98 %  05/25/19 1015 137/83 - - 84 16 96 %  05/25/19 1010 139/83 - - 85 16 97 %  05/25/19 1005 138/80 - - 84 18 97 %  05/25/19 1000 133/81 - - 84 18 98 %     FHT:  FHR: 145 bpm, variability: mod,  accelerations:  15x15,  decelerations:  Mild variables UC:   Q 2-3 minutes, mod-strong Dilation: 5 Effacement (%): 50 Cervical Position: Anterior Station: -2 Presentation: Vertex Exam by:: V Zoeann Mol CNM  AROM forebag moderate amount of clear, malodorous fluid  Labs: Results for orders placed or performed during the hospital encounter of 05/23/19 (from the past 24 hour(s))  Comprehensive metabolic panel     Status: Abnormal   Collection Time: 05/25/19  1:51 PM  Result Value Ref Range   Sodium 137 135 - 145 mmol/L   Potassium 3.6 3.5 - 5.1 mmol/L   Chloride 109 98 - 111 mmol/L   CO2 20 (L) 22 - 32 mmol/L   Glucose, Bld 108 (H) 70 - 99 mg/dL   BUN 6 6 - 20 mg/dL   Creatinine, Ser 13/05/20 0.44 - 1.00 mg/dL   Calcium 8.6 (L) 8.9 - 10.3 mg/dL   Total Protein 5.8 (L) 6.5 - 8.1 g/dL   Albumin 2.0 (L) 3.5 - 5.0 g/dL   AST 13 (L) 15 - 41 U/L   ALT 9 0 - 44 U/L   Alkaline Phosphatase 94 38 - 126 U/L   Total Bilirubin 0.4 0.3 - 1.2 mg/dL   GFR calc non Af Amer >60 >60 mL/min   GFR calc Af Amer >60 >60 mL/min   Anion gap 8 5 - 15  CBC     Status: Abnormal   Collection Time: 05/25/19  1:51 PM  Result Value Ref Range   WBC 12.3 (H) 4.0 - 10.5 K/uL   RBC 3.71 (L) 3.87 - 5.11 MIL/uL   Hemoglobin 9.9 (L) 12.0 - 15.0 g/dL   HCT 13/05/20 (L) 67.2 - 09.4 %   MCV 82.5 80.0 - 100.0 fL  MCH 26.7 26.0 - 34.0 pg   MCHC 32.4 30.0 - 36.0 g/dL   RDW 13.8 11.5 - 15.5 %   Platelets 241 150 - 400 K/uL   nRBC 0.0 0.0 - 0.2 %    Assessment / Plan: [redacted]w[redacted]d week IUP Labor: Early, protracted latent phase Fetal Wellbeing:  Category I-II, overall reassuring  Pain Control:  Epidural GHTH: New Dx. Will get Pre-E labs.  Triple I: Start ABX and Tylenol.  Anticipated MOD:  Uncertain. Will see if AROM position changes help and will titrate Pitocin to achieve   MVU's 180-240. Updated Dr. Elly Modena about minimal labor progress today, recent AROM and IUPC and new Dx Triple I. Will cautiously continue TOL for now, but may end up needing C/S if she does not progress w/ these interventions and/or if fetal status deteriorates.    Tamala Julian, [place], [place] 05/25/2019 2:59 PM

## 2019-05-26 ENCOUNTER — Encounter (HOSPITAL_COMMUNITY): Payer: Self-pay | Admitting: Obstetrics and Gynecology

## 2019-05-26 LAB — CBC
HCT: 17.6 % — ABNORMAL LOW (ref 36.0–46.0)
HCT: 24.9 % — ABNORMAL LOW (ref 36.0–46.0)
Hemoglobin: 5.7 g/dL — CL (ref 12.0–15.0)
Hemoglobin: 8.3 g/dL — ABNORMAL LOW (ref 12.0–15.0)
MCH: 27 pg (ref 26.0–34.0)
MCH: 28.2 pg (ref 26.0–34.0)
MCHC: 32.4 g/dL (ref 30.0–36.0)
MCHC: 33.3 g/dL (ref 30.0–36.0)
MCV: 83.4 fL (ref 80.0–100.0)
MCV: 84.7 fL (ref 80.0–100.0)
Platelets: 193 10*3/uL (ref 150–400)
Platelets: 175 10*3/uL (ref 150–400)
RBC: 2.11 MIL/uL — ABNORMAL LOW (ref 3.87–5.11)
RBC: 2.94 MIL/uL — ABNORMAL LOW (ref 3.87–5.11)
RDW: 14.1 % (ref 11.5–15.5)
RDW: 13.9 % (ref 11.5–15.5)
WBC: 12.2 10*3/uL — ABNORMAL HIGH (ref 4.0–10.5)
WBC: 11.3 10*3/uL — ABNORMAL HIGH (ref 4.0–10.5)
nRBC: 0 % (ref 0.0–0.2)
nRBC: 0 % (ref 0.0–0.2)

## 2019-05-26 LAB — CREATININE, SERUM
Creatinine, Ser: 0.87 mg/dL (ref 0.44–1.00)
GFR calc Af Amer: >60 mL/min (ref >60)
GFR calc non Af Amer: >60 mL/min (ref >60)

## 2019-05-26 LAB — PREPARE RBC (CROSSMATCH)

## 2019-05-26 MED ORDER — SODIUM CHLORIDE 0.9 % IV SOLN
2.0000 g | Freq: Four times a day (QID) | INTRAVENOUS | Status: AC
  Administered 2019-05-26: 2 g via INTRAVENOUS

## 2019-05-26 MED ORDER — SODIUM CHLORIDE 0.9% IV SOLUTION: Freq: Once | INTRAVENOUS | Status: DC

## 2019-05-26 MED ORDER — LACTATED RINGERS IV BOLUS
1000.0000 mL | Freq: Once | INTRAVENOUS | Status: AC
  Administered 2019-05-26: 1000 mL via INTRAVENOUS

## 2019-05-26 NOTE — Progress Notes (Signed)
POSTPARTUM PROGRESS NOTE  POD #1  Subjective:  April Strong is a 23 y.o. G1P0000 s/p pLTCS at [redacted]w[redacted]d. She reports she is doing well except for some fatigue. Denies chest pain, SOB, dizziness, though has not been ambulating yet.. She has not yet passed flatus. Pain is well controlled.  Lochia is appropriate.  Objective: Blood pressure (!) 117/58, pulse (!) 117, temperature 98.3 F (36.8 C), temperature source Oral, resp. rate 18, height 4\' 9"  (1.448 m), weight 70.3 kg, last menstrual period 08/23/2018, SpO2 100 %.  Physical Exam:  General: alert, cooperative and no distress Chest: no respiratory distress Heart:regular, tachycardic, distal pulses intact Abdomen: soft, nontender Uterine Fundus: firm, appropriately tender, 1 finger above umbilicus DVT Evaluation: No calf swelling or tenderness Extremities: no edema Skin: warm, dry; incision clean/dry/intact w/ pressure dressing in place  Recent Labs    05/25/19 1351 05/26/19 0105  HGB 9.9* 5.7*  HCT 30.6* 17.6*    Assessment/Plan: April Strong is a 23 y.o. G1P0000 s/p pLTCS at [redacted]w[redacted]d for arrest of dilation.  POD#1 - Doing well; pain well controlled. H/H appropriate  Routine postpartum care  OOB, ambulation  Lovenox for VTE prophylaxis  Anemia: acute drop with hgb 9.9>5.7 post op and poor UOP despite bolus of 2L, patient also with tachycardia to 110's compared to normal HR prior to CS. EBL 779 so drop would be out of proportion, but based on my observation during case may be underestimation. Exam appropriate, do not suspect occult bleeding Plan to transfuse 2u pRBC, patient agrees with plan.  Contraception: OCPs Feeding: breast  GHTN: BP's well controlled since delivery  Triple I: cont abx for 24h PP  Dispo: Plan for discharge POD#2-3.   LOS: 3 days   Augustin Coupe, MD/[facility] OB Fellow  05/26/2019, 2:16 AM

## 2019-05-26 NOTE — Lactation Note (Signed)
This note was copied from a baby's chart. Lactation Consultation Note: P1, infant 17 hours old. Infant 39 weeks IUGR, 5-12.  Infant has had several attempts to breastfeed.  Mother reports that he has latched with only a few sucks . Infant was spoon fed 1.5 ml of ebm . Infant has not had a sustained latch.   Basic breastfeeding teaching done .  Assist mother with hand expression. Obtained a few drops on a spoon.  Mother has large breast , large flat nipples with areola edema . Assist mother with putting bra on and applying shells.  Assist with  reverse pressure and pre pumping with hand pump and mothers nipple protruded slightly.   Attempt to latch infant on with infant placed on pillows in football hold. Infant cueing when first placed to the breast. He tugged with just a few tuggs.  Assessed infant tongue with a glove finger to stimulate suckling. No suckling on gloved finger.   Mother fit with a #24 NS. Infant openned and placed mouth around the shield but no suckling.   Discussed the use of formula to supplement infant . Parents are not ready to offer formula.  Parents were given parent teaching sheet with formula parameters.  Discussed infants need for calories .  Discussion with parents about offering supplement ebm/formula if infant doesn't eat the next feeding attempt.   Staff nurse Lovena Le to sat up DEBP and assist mother with first pumping.  Advised frequent STS with parents. Parents very receptive to all teaching.  Mother has [facility]. A WIC referral was sent.  Lactation Brochure given with information on all [place] services.   Patient Name: April Strong [id number] Date: 05/26/2019 Reason for consult: Initial assessment   Maternal Data Has patient been taught Hand Expression?: Yes Does the patient have breastfeeding experience prior to this delivery?: No  Feeding Feeding Type: Breast Fed  LATCH Score Latch: Too sleepy or reluctant, no latch achieved, no sucking  elicited.  Audible Swallowing: None  Type of Nipple: Flat  Comfort (Breast/Nipple): Soft / non-tender(areola edema)  Hold (Positioning): Full assist, staff holds infant at breast  LATCH Score: 3  Interventions Interventions: Breast feeding basics reviewed;Assisted with latch;Skin to skin;Breast massage;Hand express;Pre-pump if needed;Reverse pressure;Breast compression;Adjust position;Support pillows;Position options;Shells;Hand pump  Lactation Tools Discussed/Used Tools: Nipple Shields Nipple shield size: 24 WIC Program: Yes Pump Review: Setup, frequency, and cleaning;Milk Storage Initiated by:: Jess Barters RN,IBCLC Date initiated:: 05/26/19   Consult Status Consult Status: Follow-up Date: 05/27/19 Follow-up type: In-patient    Jess Barters [facility] 05/26/2019, 3:57 PM

## 2019-05-26 NOTE — Progress Notes (Signed)
Delivery summary marked as "Vaginal delivery" when infant was actually born via C-Section.  Delivery summary changed to C-Section delivery.

## 2019-05-27 LAB — BPAM RBC
Blood Product Expiration Date: 202012052359
Blood Product Expiration Date: 202012092359
ISSUE DATE / TIME: 202011060226
ISSUE DATE / TIME: 202011060628
Unit Type and Rh: 5100
Unit Type and Rh: 5100

## 2019-05-27 LAB — TYPE AND SCREEN
ABO/RH(D): O POS
Antibody Screen: NEGATIVE
Unit division: 0
Unit division: 0

## 2019-05-27 MED ORDER — ACETAMINOPHEN 500 MG PO TABS: 1000.0000 mg | ORAL_TABLET | Freq: Four times a day (QID) | ORAL | 0 refills | Status: DC | PRN

## 2019-05-27 MED ORDER — IBUPROFEN 800 MG PO TABS: 800.0000 mg | ORAL_TABLET | Freq: Three times a day (TID) | ORAL | 0 refills | Status: DC

## 2019-05-27 MED ORDER — NORETHINDRONE 0.35 MG PO TABS: 1.0000 | ORAL_TABLET | Freq: Every day | ORAL | 11 refills | Status: DC

## 2019-05-27 MED ORDER — OXYCODONE HCL 5 MG PO TABS: 5.0000 mg | ORAL_TABLET | Freq: Four times a day (QID) | ORAL | 0 refills | Status: DC | PRN

## 2019-05-27 NOTE — Discharge Summary (Addendum)
Postpartum Discharge Summary   Patient Name: Keyara Ent DOB: Jun 02, 1996 MRN: [id number]  Date of admission: 05/23/2019 Delivering Provider: Clarnce Flock   Date of discharge: 05/28/2019  Admitting diagnosis: INDUCTION Intrauterine pregnancy: [redacted]w[redacted]d    Secondary diagnosis:  Principal Problem:   Encounter for induction of labor Active Problems:   IUGR (intrauterine growth restriction) affecting care of mother, third trimester, fetus 1   Chorioamnionitis   Gestational hypertension w/o significant proteinuria in 3rd trimester  Additional problems: None     Discharge diagnosis: Term Pregnancy Delivered                                                                                                Post partum procedures:blood transfusion  Augmentation: AROM, Pitocin, Cytotec and Foley Balloon  Complications: Intrauterine Inflammation or infection (Chorioamniotis)  Hospital course:  Induction of Labor With Cesarean Section  23y.o. yo G1P0000 at 353w2das admitted to the hospital 05/23/2019 for induction of labor. Patient had a labor course significant for: long induction for almost 48h, started with misoprostol x6 and then foley bulb. Subsequently had pitocin and AROM. Reached 5cm dilation at 0800 on 05/25/2019 and remained at 5cm for approximately 10 hours, at which point diagnosed with arrest of dilation. She developed intrapartum gHTN and had negative PEC labs. She also developed chorioamnionitis and was treated.  The patient went for cesarean section due to Arrest of Dilation, and delivered a Viable infant,05/25/2019  Membrane Rupture Time/Date: 2:44 PM ,05/25/2019   Details of operation can be found in separate operative Note.  Patient had an uncomplicated postpartum course. She is ambulating, tolerating a regular diet, passing flatus, and urinating well.  Patient is discharged home in stable condition on 05/28/2019.                                  Delivery time: 8:22  PM   Magnesium Sulfate received: No BMZ received: No Rhophylac:N/A MMR:N/A Transfusion:Yes  Physical exam  Vitals:   05/26/19 2158 05/27/19 0508 05/27/19 2105 05/28/19 0650  BP: 122/78 117/87 125/76 118/72  Pulse: 80 74 76 76  Resp: 18 18 18 18   Temp: 98.4 F (36.9 C) 98.5 F (36.9 C) 98.5 F (36.9 C) 98.3 F (36.8 C)  TempSrc: Oral Oral Oral Oral  SpO2: 100% 98%    Weight:      Height:       General: alert, cooperative and no distress  Lochia: appropriate Uterine Fundus: firm Incision: Healing well with no significant drainage, No significant erythema, Dressing is clean and dry but lifting on one side, order placed to be changed. DVT Evaluation: No evidence of DVT seen on physical exam. Labs: Lab Results  Component Value Date   WBC 11.3 (H) 05/26/2019   HGB 8.3 (L) 05/26/2019   HCT 24.9 (L) 05/26/2019   MCV 84.7 05/26/2019   PLT 175 05/26/2019   CMP Latest Ref Rng & Units 05/26/2019  Glucose 70 - 99 mg/dL -  BUN 6 - 20 mg/dL -  Creatinine 0.44 - 1.00 mg/dL 0.87  Sodium 135 - 145 mmol/L -  Potassium 3.5 - 5.1 mmol/L -  Chloride 98 - 111 mmol/L -  CO2 22 - 32 mmol/L -  Calcium 8.9 - 10.3 mg/dL -  Total Protein 6.5 - 8.1 g/dL -  Total Bilirubin 0.3 - 1.2 mg/dL -  Alkaline Phos 38 - 126 U/L -  AST 15 - 41 U/L -  ALT 0 - 44 U/L -    Discharge instruction: per After Visit Summary and "Baby and Me Booklet".  After visit meds:  Allergies as of 05/28/2019   No Known Allergies     Medication List    STOP taking these medications   calcium carbonate 500 MG chewable tablet Commonly known as: TUMS - dosed in mg elemental calcium   cyclobenzaprine 10 MG tablet Commonly known as: FLEXERIL   nitrofurantoin (macrocrystal-monohydrate) 100 MG capsule Commonly known as: MACROBID   Vitafol Gummies 3.33-0.333-34.8 MG Chew     TAKE these medications   acetaminophen 500 MG tablet Commonly known as: TYLENOL Take 2 tablets (1,000 mg total) by mouth every 6 (six)  hours as needed for fever or headache.   ibuprofen 800 MG tablet Commonly known as: ADVIL Take 1 tablet (800 mg total) by mouth every 8 (eight) hours.   norethindrone 0.35 MG tablet Commonly known as: MICRONOR Take 1 tablet (0.35 mg total) by mouth daily. Start the pill daily at 3 weeks postpartum. Take daily at the same time.   oxyCODONE 5 MG immediate release tablet Commonly known as: Roxicodone Take 1 tablet (5 mg total) by mouth every 6 (six) hours as needed for up to 20 doses for moderate pain, severe pain or breakthrough pain.   PRENATAL VITAMINS PO Take by mouth.       Diet: routine diet  Activity: Advance as tolerated. Pelvic rest for 6 weeks.   Outpatient follow up:6 weeks Follow up Appt:No future appointments. Follow up Visit:  Please schedule this patient for Postpartum visit in: 6 weeks with the following provider: Any provider For C/S patients schedule nurse incision check in weeks 2 weeks: yes High risk pregnancy complicated by: intrapartum gestational HTN, IUGR Delivery mode: CS Anticipated Birth Control: OCPs PP Procedures needed: BP check, incision check  Schedule Integrated BH visit: no  Newborn Data: Live born female  Birth Weight: 5 lb 12.2 oz (2615 g) APGAR: 9, 9  Newborn Delivery   Birth date/time: 05/25/2019 20:22:00 Delivery type: C-Section, Low Transverse Trial of labor: Yes C-section categorization: Primary      Baby Feeding: Breast Disposition:home with mother   05/28/2019 Gifford Shave, MD  Midwife attestation I have seen and examined this patient and agree with above documentation in the resident's note.   Tarryn Unique Wigle is a 23 y.o. G1P1001 s/p PCS. Pain is well controlled. Plan for birth control is oral progesterone-only contraceptive. Method of Feeding: breast  PE:  Gen: well appearing Heart: reg rate Lungs: normal WOB Fundus firm Ext: no pain, no edema  Recent Labs    05/26/19 0105 05/26/19 1314  HGB 5.7*  8.3*  HCT 17.6* 24.9*   Assessment S/p CS POD # 3 GHTN, delivered  Plan: - discharge home - postpartum care discussed - f/u in office in 1 week for BP and incision check   Julianne Handler, CNM 10:16 AM

## 2019-05-27 NOTE — Lactation Note (Addendum)
This note was copied from a baby's chart. Lactation Consultation Note Baby 70 hrs old, elevated bili, poor feeder. Mom so sleepy LC had a hard waking mom up. FOB sound asleep. Asked mom which milk was still good, LC wanted to try to get baby to take more and work on suck training. Mom gave LC syring and milk. LC checked diaper to stimulate baby, diaper dry. Baby awake. W/gloved finger worked on Catering manager. Baby chomped, not extending tongue under finger. Biting. Has small mouth. W/curve tip syring gave 10 ml Similac 22 cal. Finger feeding slowly. Took a while getting baby suckling well. Has uncoordinated suck/swallow coordination. Burped baby placed in crib. Mom still sleeping. Spoke w/RN, asking to call [facility] for next feeding.  Baby may need nipple to learn to suck and feed well.  LC attempted to assess for tongue tie, baby will not extend tongue hardly past gum line and chomps instead of suckling. May need ST evaluation if not improved in next 24 hrs. Baby needs to be feeding d/t elevated bili.  Patient Name: April Strong [id number] Date: 05/27/2019 Reason for consult: Follow-up assessment;Infant < 6lbs;Primapara   Maternal Data    Feeding Feeding Type: Formula  LATCH Score                   Interventions    Lactation Tools Discussed/Used     Consult Status Consult Status: Follow-up Date: 05/27/19 Follow-up type: In-patient    Theodoro Kalata 05/27/2019, 1:36 AM

## 2019-05-27 NOTE — Lactation Note (Signed)
This note was copied from a baby's chart. Lactation Consultation Note  Patient Name: April Strong [id number] Date: 05/27/2019 Reason for consult: Follow-up assessment;Difficult latch;Primapara;1st time breastfeeding;Term;Infant < 6lbs;Infant weight loss  Baby is 29 hours old  As LC entered the room per  baby had not fed since 12 noon.  Mom mentioned the baby had been feeding poorly even on a bottle and started  On a purple nipple this am and did well. Since then graduated to the yellow nipple tolerated well.  LC assessed breast tissue and noted both areolas to be non - compressible, tough. LC recommended for the RN to obtain coconut oil for mom and she use a dab on each nipple and areola , reverse pressure and pump both breast with the DEBP already set up. Between feedings breast shells except when sleeping.  Feed the baby with a bottle with feeding cues and by 3 hours at least 30 ml.  LC encouraged mom to follow the [facility] plan and mentioned it will take time.   Breast tissue is not a candidate for a Nipple Shield. [facility] also mentioned this RN and mom ).    Maternal Data Has patient been taught Hand Expression?: Yes  Feeding Feeding Type: Bottle Fed - Formula Nipple Type: Slow - flow  LATCH Score                   Interventions Interventions: Breast feeding basics reviewed  Lactation Tools Discussed/Used     Consult Status Consult Status: Follow-up Date: 05/28/19 Follow-up type: In-patient    Los Ybanez 05/27/2019, 4:08 PM

## 2019-05-27 NOTE — Discharge Instructions (Signed)
Postpartum Care After Cesarean Delivery °This sheet gives you information about how to care for yourself from the time you deliver your baby to up to 6-12 weeks after delivery (postpartum period). Your health care provider may also give you more specific instructions. If you have problems or questions, contact your health care provider. °Follow these instructions at home: °Medicines °· Take over-the-counter and prescription medicines only as told by your health care provider. °· If you were prescribed an antibiotic medicine, take it as told by your health care provider. Do not stop taking the antibiotic even if you start to feel better. °· Ask your health care provider if the medicine prescribed to you: °? Requires you to avoid driving or using heavy machinery. °? Can cause constipation. You may need to take actions to prevent or treat constipation, such as: °§ Drink enough fluid to keep your urine pale yellow. °§ Take over-the-counter or prescription medicines. °§ Eat foods that are high in fiber, such as beans, whole grains, and fresh fruits and vegetables. °§ Limit foods that are high in fat and processed sugars, such as fried or sweet foods. °Activity °· Gradually return to your normal activities as told by your health care provider. °· Avoid activities that take a lot of effort and energy (are strenuous) until approved by your health care provider. Walking at a slow to moderate pace is usually safe. Ask your health care provider what activities are safe for you. °? Do not lift anything that is heavier than your baby or 10 lb (4.5 kg) as told by your health care provider. °? Do not vacuum, climb stairs, or drive a car for as long as told by your health care provider. °· If possible, have someone help you at home until you are able to do your usual activities yourself. °· Rest as much as possible. Try to rest or take naps while your baby is sleeping. °Vaginal bleeding °· It is normal to have vaginal bleeding  (lochia) after delivery. Wear a sanitary pad to absorb vaginal bleeding and discharge. °? During the first week after delivery, the amount and appearance of lochia is often similar to a menstrual period. °? Over the next few weeks, it will gradually decrease to a dry, yellow-brown discharge. °? For most women, lochia stops completely by 4-6 weeks after delivery. Vaginal bleeding can vary from woman to woman. °· Change your sanitary pads frequently. Watch for any changes in your flow, such as: °? A sudden increase in volume. °? A change in color. °? Large blood clots. °· If you pass a blood clot, save it and call your health care provider to discuss. Do not flush blood clots down the toilet before you get instructions from your health care provider. °· Do not use tampons or douches until your health care provider says this is safe. °· If you are not breastfeeding, your period should return 6-8 weeks after delivery. If you are breastfeeding, your period may return anytime between 8 weeks after delivery and the time that you stop breastfeeding. °Perineal care ° °· If your C-section (Cesarean section) was unplanned, and you were allowed to labor and push before delivery, you may have pain, swelling, and discomfort of the tissue between your vaginal opening and your anus (perineum). You may also have an incision in the tissue (episiotomy) or the tissue may have torn during delivery. Follow these instructions as told by your health care provider: °? Keep your perineum clean and dry as told by   your health care provider. Use medicated pads and pain-relieving sprays and creams as directed. °? If you have an episiotomy or vaginal tear, check the area every day for signs of infection. Check for: °§ Redness, swelling, or pain. °§ Fluid or blood. °§ Warmth. °§ Pus or a bad smell. °? You may be given a squirt bottle to use instead of wiping to clean the perineum area after you go to the bathroom. As you start healing, you may use  the squirt bottle before wiping yourself. Make sure to wipe gently. °? To relieve pain caused by an episiotomy, vaginal tear, or hemorrhoids, try taking a warm sitz bath 2-3 times a day. A sitz bath is a warm water bath that is taken while you are sitting down. The water should only come up to your hips and should cover your buttocks. °Breast care °· Within the first few days after delivery, your breasts may feel heavy, full, and uncomfortable (breast engorgement). You may also have milk leaking from your breasts. Your health care provider can suggest ways to help relieve breast discomfort. Breast engorgement should go away within a few days. °· If you are breastfeeding: °? Wear a bra that supports your breasts and fits you well. °? Keep your nipples clean and dry. Apply creams and ointments as told by your health care provider. °? You may need to use breast pads to absorb milk leakage. °? You may have uterine contractions every time you breastfeed for several weeks after delivery. Uterine contractions help your uterus return to its normal size. °? If you have any problems with breastfeeding, work with your health care provider or a lactation consultant. °· If you are not breastfeeding: °? Avoid touching your breasts as this can make your breasts produce more milk. °? Wear a well-fitting bra and use cold packs to help with swelling. °? Do not squeeze out (express) milk. This causes you to make more milk. °Intimacy and sexuality °· Ask your health care provider when you can engage in sexual activity. This may depend on your: °? Risk of infection. °? Healing rate. °? Comfort and desire to engage in sexual activity. °· You are able to get pregnant after delivery, even if you have not had your period. If desired, talk with your health care provider about methods of family planning or birth control (contraception). °Lifestyle °· Do not use any products that contain nicotine or tobacco, such as cigarettes, e-cigarettes,  and chewing tobacco. If you need help quitting, ask your health care provider. °· Do not drink alcohol, especially if you are breastfeeding. °Eating and drinking ° °· Drink enough fluid to keep your urine pale yellow. °· Eat high-fiber foods every day. These may help prevent or relieve constipation. High-fiber foods include: °? Whole grain cereals and breads. °? Brown rice. °? Beans. °? Fresh fruits and vegetables. °· Take your prenatal vitamins until your postpartum checkup or until your health care provider tells you it is okay to stop. °General instructions °· Keep all follow-up visits for you and your baby as told by your health care provider. Most women visit their health care provider for a postpartum checkup within the first 3-6 weeks after delivery. °Contact a health care provider if you: °· Feel unable to cope with the changes that a new baby brings to your life, and these feelings do not go away. °· Feel unusually sad or worried. °· Have breasts that are painful, hard, or turn red. °· Have a fever. °·   Have trouble holding urine or keeping urine from leaking. °· Have little or no interest in activities you used to enjoy. °· Have not breastfed at all and you have not had a menstrual period for 12 weeks after delivery. °· Have stopped breastfeeding and you have not had a menstrual period for 12 weeks after you stopped breastfeeding. °· Have questions about caring for yourself or your baby. °· Pass a blood clot from your vagina. °Get help right away if you: °· Have chest pain. °· Have difficulty breathing. °· Have sudden, severe leg pain. °· Have severe pain or cramping in your abdomen. °· Bleed from your vagina so much that you fill more than one sanitary pad in one hour. Bleeding should not be heavier than your heaviest period. °· Develop a severe headache. °· Faint. °· Have blurred vision or spots in your vision. °· Have a bad-smelling vaginal discharge. °· Have thoughts about hurting yourself or your  baby. °If you ever feel like you may hurt yourself or others, or have thoughts about taking your own life, get help right away. You can go to your nearest emergency department or call: °· Your local emergency services (911 in the [place] °· A suicide crisis helpline, such as the National Suicide Prevention Lifeline at 1-800-273-8255. This is open 24 hours a day. °Summary °· The period of time from when you deliver your baby to up to 6-12 weeks after delivery is called the postpartum period. °· Gradually return to your normal activities as told by your health care provider. °· Keep all follow-up visits for you and your baby as told by your health care provider. °This information is not intended to replace advice given to you by your health care provider. Make sure you discuss any questions you have with your health care provider. °Document Released: 07/03/2000 Document Revised: 02/23/2018 Document Reviewed: 02/23/2018 °Elsevier Patient Education © 2020 [employer] Inc. ° °

## 2019-05-27 NOTE — Progress Notes (Signed)
POSTPARTUM PROGRESS NOTE  POD #2  Subjective:  April Strong is a 23 y.o. G1P1001 s/p LTCS at [redacted]w[redacted]d after arrest of decent.  She reports she doing well. No acute events overnight. She denies any problems with ambulating, voiding or po intake. Denies nausea or vomiting. She has passed flatus but has not had BM. Pain is well controlled.  Lochia is appropriate.  Patient received 2 units of blood yesterday and patient denies any symptoms of anemia such as lightheadedness, dizziness.  Objective: Blood pressure 117/87, pulse 74, temperature 98.5 F (36.9 C), temperature source Oral, resp. rate 18, height 4\' 9"  (1.448 m), weight 70.3 kg, last menstrual period 08/23/2018, SpO2 98 %, unknown if currently breastfeeding.  Physical Exam:  General: alert, cooperative and no distress Chest: no respiratory distress Heart:regular rate, distal pulses intact Abdomen: soft, nontender,  Uterine Fundus: firm, appropriately tender DVT Evaluation: No calf swelling or tenderness Extremities: No lower extremity edema Skin: warm, dry; honeycomb dressing and pressure dressing in place  Recent Labs    05/26/19 0105 05/26/19 1314  HGB 5.7* 8.3*  HCT 17.6* 24.9*    Assessment/Plan: April Strong is a 23 y.o. G1P1001 s/p LTCS at [redacted]w[redacted]d for arrest of descent/dilation.  POD#2- Doing welll; pain well controlled. H/H appropriate  Routine postpartum care  OOB, ambulated  Lovenox for VTE prophylaxis             Anemia: asymptomatic, s/p 2 unit transfusion   Contraception: POP Feeding: Breast  Dispo: Plan for discharge either today or tomorrow depending on baby.   LOS: 4 days   Gifford Shave, MD  PGY-1, [facility]  05/27/2019, 8:55 AM

## 2019-05-28 NOTE — Lactation Note (Signed)
This note was copied from a baby's chart. Lactation Consultation Note  Patient Name: April Strong [id number] Date: 05/28/2019 Reason for consult: Follow-up assessment;Difficult latch;Primapara;1st time breastfeeding(mom to call for completion of the [facility] loaner -) Baby is 58 hours old  As LC entered the room baby on moms chest and baby asleep , mom soundly sleeping. LC woke her up and mentioned its not safe to sleep in bed with baby.  As [place] laid the baby down, baby awake and fussy. LC changed a wet diaper, fixed a bottle to feed with 30 ml , baby tool 22 ml with yellow nipple without problem. And settled after feeding.  LC stressed the importance even though baby's weight is good with 3 % weight loss its important to feed with cues and by 3 hours. 8-12 feedings in 24 hours.  Baby has to take at least 30 ml for a feeding and due to being 60 hours old  Gradually increase volume to 45 ml - 60 within the 1st 2 weeks.  Per mom has pumped x 2 since [place] saw  Mom yesterday, but was encouraged the coconut oil is softening the breast tissue and she pumped off 5 ml and has been leaking this morning. Mom denies soreness . Sore nipple and engorgement prevention and tx reviewed.  LC reviewed supply and demand/ importance of consistent pumping around the clock to establish and protect milk supply. Storage of breast milk page 41 Mother and Baby care booklet.  LC recommended F/U with O/P appt in 5-7 days and mom receptive.  LC placed a request in the Epic basket for the clinic .  Mom aware the [facility] O/P will call her to set up the appt.  Mom has the [facility] loaner paperwork to complete and knows to call on the nurses light for [place] .    Maternal Data    Feeding Feeding Type: Formula Nipple Type: Slow - flow(yellow nipple )  LATCH Score                   Interventions Interventions: Breast feeding basics reviewed  Lactation Tools Discussed/Used WIC Program: Yes(referral sent )   Consult  Status Consult Status: Follow-up Date: 05/28/19 Follow-up type: In-patient    [place] 05/28/2019, 9:36 AM

## 2019-05-28 NOTE — Lactation Note (Signed)
This note was copied from a baby's chart. Lactation Consultation Note  Patient Name: April Strong [id number] Date: 05/28/2019 Reason for consult: Follow-up assessment  2nd [place] visit for today at 1345 to complete [facility] DEBP loaner process.  Mom had the paperwork completed and dad came in and mentioned is  Sister had a DEBP Medela pump mom could use just needed the parts.  LC instructed mom and dad how to cut the DEBP Symphony tubing to fit their Medela. LC recommended  for mom to take the completed paperwork home and shred it or LC could place it in the locked shredder on the unit and mom requested for LC to do that.  LC reminded mom that the clinic would be calling her for appt.    Maternal Data    Feeding    LATCH Score                   Interventions Interventions: Breast feeding basics reviewed  Lactation Tools Discussed/Used WIC Program: (see LC note for situation with [facility] loaner decline )   Consult Status Consult Status: Follow-up(LC Placed a request in Epic basket for LC O/P ) Follow-up type: [place] 05/28/2019, 2:27 PM

## 2019-05-29 LAB — SURGICAL PATHOLOGY

## 2019-05-30 ENCOUNTER — Ambulatory Visit: Payer: Self-pay

## 2019-05-30 NOTE — Lactation Note (Signed)
This note was copied from a baby's chart. Lactation Consultation Note  Patient Name: April Strong. Today's Date: 05/30/2019     05/30/2019  Name: April Strong. MRN: [id number] Date of Birth: 05/25/2020 Gestational Age: Gestational Age: [redacted]w[redacted]d Birth Weight: 92.2 oz Weight today:  Weight: 5 lb 6.8 oz (2460 g)  5 day old term IUGR infant presents today with mom and dad for feeding assessment.   Infant has lost 69 grams in the last 2 days.   Mom is pumping and bottle feeding infant.   Mom not able to get NS to stay on, worked with her on placing and it stayed on today.   Worked with mom on positioning and keeping infant awake. Worked with mom on latching infant.   Infant to follow up with Dr. Winn Jock today. Infant to follow with Lactation in 1 week.   Parents to go and get formula when they leave today. Parents  Informed infant needs to increase volumes as needed. Mom to ask Ped for prescription for [facility] for Neosure 22 calorie.   Parents to call with questions or concerns as needed.   General Information: Mother's reason for visit: Feeding assessment, > 6 pound infant (IUGR) Consult: Initial Lactation consultant: Noralee Stain RN,IBCLC Breastfeeding experience: Not latching Maternal medical conditions: Pregnancy induced hypertension Maternal medications: Other, Pre-natal vitamin(Oxycodone)  Breastfeeding History: Frequency of breast feeding: not latching    Supplementation: Supplement method: bottle(Medela Extra Slow flow nipple) Brand: Similac(Neosure 22 calorie) Formula volume: 1.5-2 ounces Formula frequency: every 2 hours   Breast milk volume: 1/2 ounce     Pump type: Medela pump in style Pump frequency: every 2-3 hours Pump volume: 25-50 ml  Infant Output Assessment: Voids per 24 hours: 8+ Urine color: Clear yellow Stools per 24 hours: 10+ Stool color: Yellow  Breast Assessment: Breast: Soft, Compressible, Hyperplasia(right larger than  the left breast, areola edema to right breast) Nipple: Flat Pain level: 0 Pain interventions: Bra, Coconut oil  Feeding Assessment: Infant oral assessment: Variance Infant oral assessment comment: Infant with strong suckle on gloved finger and good tongue cupping. Infant with posterior lingual frenulum with some decreased midtongue elevation Positioning: Cross cradle(right breast, 5 minutes) Latch: 1 - Repeated attempts needed to sustain latch, nipple held in mouth throughout feeding, stimulation needed to elicit sucking reflex. Audible swallowing: 1 - A few with stimulation Type of nipple: 1 - Flat Comfort: 2 - Soft/non-tender Hold: 1 - Assistance needed to correctly position infant at breast and maintain latch LATCH score: 6 Latch assessment: Deep(unable to latch without the NS) Lips flanged: Yes Suck assessment: Nonnutritive Tools: Nipple shield 24 mm Pre-feed weight: 2460 grams Post feed weight: 2460 grams Amount transferred: 0 Amount supplemented: mom pumped and supplemented infant , parents to go and get formula to supplement infant  Additional Feeding Assessment:                                    Totals: Total amount transferred: 0 Total supplement given: 30 EBM via bottle Total amount pumped post feed: 30 ml   Plan: 1. Offer infant the breast with feeding cues Limit breast feeding to 20 minutes if infant is sleepy at the breast, if feeding is limited at the breast, offer infant supplement of formula or breast milk.  Attempt breast feeding 1-2 x a day.  2. Pre-pump breast initially to help evert nipple. Reverse pressure to areola  if swollen prior to pumping or feeding 3. Keep infant awake at the breast as needed with feeding 4. Feed infant skin to skin with breast feeding 4. Massage/compress the breast with feedings as needed to keep infant awake and active at the breast 5. Use the # 24 Nipple Shield with feeding as needed to keep infant latched 5.  When giving a bottle, offer using the paced bottle feeding method 6. Infant needs about 45-60 ml (1.5-2 ounces) for 8 feedings a day. Infant may take more or less per feeding depending on how often he feeds. Feed infant until he is satisfied.  7. Would recommend that you pump about 8 times a day for 15-20 minutes with your Double electric breast pump to promote and protect milk supply.  8. Keep up the good work 9. Thank you for allowing me to assist you today 10. Please call with any questions or concerns as needed [phone] 11. Follow up with Lactation in 1 week  [place], IBCLC                                                    Donn Pierini 05/30/2019, 8:48 AM

## 2019-06-06 ENCOUNTER — Ambulatory Visit: Payer: Self-pay

## 2019-06-06 NOTE — Lactation Note (Signed)
This note was copied from a baby's chart. Lactation Consultation Note  Patient Name: April Strong. Today's Date: 06/06/2019     06/06/2019  Name: April Strong. MRN: [id number] Date of Birth: 05/25/2020 Gestational Age: Gestational Age: [redacted]w[redacted]d Birth Weight: 92.2 oz Weight today:  Weight: 5 lb 11.7 oz (2600 g)   12 day old term, IUGR infant presents today with mom for follow up feeding assessment.  Infant is gained 140 grams in the last 7 days with an average daily weight gain of 20 grams a day. Infant 15 grams shy of his birthweight. Weight gain is less   Mom reports infant fed on the breast for a few days and then she pumped and bottle fed him.   Mom is pumping about every 2-3 hours and getting 60-150 ml. Infant is getting all EBM. Mom has adequate milk to feed infant and has some stored.   Breasts are soft and compressible. Areolar edema is gone. Nipple everts better today. NS staying on a little better. Nipple is still a little thick and infant not able to sustain latch without the NS. Mom did well with placing NS and supporting with with feeding.   Attempted to latch infant to the breast without the NS. Areola is compressable however nipple is large and infant mouth is smaller. Infant not able to sustain latch without the NS. NS applied and infant latched. Infant latched well and fed off and on for about 10 minutes and transferred 16 ml. Infant burped and latched to the left breast and fed for about   Discussed with mom that due to infant size, infant may not have a lot of energy to feed at the breast for a full feeding. Discussed continuing using the NS and supplementing after the breast. Enc mom to continue to offer the breast a few times a day to practice at BF.   Infant with strong suckle on gloved finger and good tongue cupping. Infant with posterior lingual frenulum with some decreased midtongue elevation. Infant tires easily with feedings at the breast  and needs the NS to feed. Would like to reassess as infant grows and see how he is doing.   Infant to follow up with Dr. Roosvelt Harps depending on weight today. Infant to follow up with Lactation in 2 weeks.       General Information: Mother's reason for visit: Follow up feeding assessment, IUGR infant Consult: Follow-up Lactation consultant: Nonah Mattes RN,IBCLC Breastfeeding experience: not latching currently Maternal medical conditions: Pregnancy induced hypertension Maternal medications: Pre-natal vitamin, Other(Oxycodone)  Breastfeeding History: Frequency of breast feeding: not latching recently    Supplementation: Supplement method: bottle(Enfamil Preemie Nipple (Purple Ringed nipple))         Breast milk volume: 2.5-3 ounces Breast milk frequency: every 2-4 hours, at least 8 feedings a day   Pump type: Medela pump in style Pump frequency: every 2-3 hours Pump volume: 3-5 ounces  Infant Output Assessment: Voids per 24 hours: 8+ Urine color: Clear yellow Stools per 24 hours: 8+ Stool color: Yellow  Breast Assessment: Breast: Soft, Compressible Nipple: Erect Pain level: 0 Pain interventions: Bra, Expressed breast milk, Breast pump, Nipple shield  Feeding Assessment: Infant oral assessment: Variance Infant oral assessment comment: see note Positioning: Cross cradle(right breast, 10  minutes) Latch: 1 - Repeated attempts needed to sustain latch, nipple held in mouth throughout feeding, stimulation needed to elicit sucking reflex. Audible swallowing: 2 - Spontaneous and intermittent Type of nipple: 2 - Everted at  rest and after stimulation Comfort: 2 - Soft/non-tender Hold: 2 - No assistance needed to correctly position infant at breast LATCH score: 9 Latch assessment: Deep Lips flanged: Yes Suck assessment: Displays both Tools: Nipple shield 24 mm Pre-feed weight: 2600 grams Post feed weight: 2616 grams Amount transferred: 16 Amount supplemented:  0  Additional Feeding Assessment: Infant oral assessment: Variance Infant oral assessment comment: see note Positioning: Cross cradle(left breast, 15 minutes) Latch: 1 - Repeated attempts neede to sustain latch, nipple held in mouth throughout feeding, stimulation needed to elicit sucking reflex. Audible swallowing: 2 - Spontaneous and intermittent Type of nipple: 2 - Everted at rest and after stimulation Comfort: 2 - Soft/non-tender Hold: 2 - No assistance needed to correctly position infant at breast LATCH score: 9 Latch assessment: Deep Lips flanged: Yes Suck assessment: Displays both Tools: Nipple shield 24 mm Pre-feed weight: 2616 grams Post feed weight: 2636 grams Amount transferred: 20 ml Amount supplemented: 30 ml EBM via bottle  Totals: Total amount transferred: 36 ml Total supplement given: 30 ml EBM via bottle Total amount pumped post feed: did not pump in office   [place] RN, IBCLC                                                      Silas Flood Lavonia Eager 06/06/2019, 8:33 AM

## 2019-06-12 ENCOUNTER — Other Ambulatory Visit: Payer: Self-pay

## 2019-06-12 ENCOUNTER — Ambulatory Visit (INDEPENDENT_AMBULATORY_CARE_PROVIDER_SITE_OTHER): Payer: Medicaid Other

## 2019-06-12 VITALS — BP 92/58 | HR 90 | Wt 133.6 lb

## 2019-06-12 DIAGNOSIS — Z5189 Encounter for other specified aftercare: Secondary | ICD-10-CM

## 2019-06-12 NOTE — Progress Notes (Signed)
Chart reviewed - agree with RN documentation.   

## 2019-06-12 NOTE — Progress Notes (Signed)
Pt here today for incision check s/p 05/25/19.  Pt denies any pain or vaginal bleeding.  Incision well approximated, no odor, no drainage, and no erythema.  Pt advised to contact the [facility] to f/u in regards to pp visit being scheduled.  Pt verbalized understanding with no further questions.  Mel Almond, RN 06/12/19

## 2019-06-20 ENCOUNTER — Ambulatory Visit: Payer: Self-pay

## 2019-06-20 NOTE — Lactation Note (Signed)
This note was copied from a baby's chart. Lactation Consultation Note  Patient Name: April Strong. Today's Date: 06/20/2019     06/20/2019  Name: April Strong. MRN: [id number] Date of Birth: 05/25/2020 Gestational Age: Gestational Age: [redacted]w[redacted]d Birth Weight: 92.2 oz Weight today:  Weight: 6 lb 13.4 oz (5329 g)   77 week old infant presents today with mom for follow up feeding assessment.   Infant has gained 500 grams in the last 14 days with an average daily weight gain of 36 grams a day.   Mom is BF infant twice a day and he will feed on one breast for about 10-15 minutes. He uses the # 24 NS with feedings. Mom hears swallows with feedings and sometimes has breast softening. Infant usually wants a bottle after BF, he usually takes about 1.5 ounces post BF.   Infant has been eating more frequently the past few days-suspect growth spurt. Infant is more alert and seems to have more energy. Enc mom to continue increasing offering the breast more as infant wants.   Pumping is going well. Mom has enough milk to feed infant. She has some milk stored.   Infant with strong suckle on gloved finger and good tongue cupping. Infant with posterior lingual frenulum with some decreased midtongue elevation. Infant tires easily with feedings at the breast and needs the NS to feed. Would like to reassess as infant grows and see how he is doing.   Infant latched to the right breast with the # 24 NS. Infant was initially sleepy, after stimulation infant did latch and feed actively. Enc mom to stimulate infant as needed with feeding.   Mom feels like BF is improving. She is pleased infant is gaining weight. She feels like the hard work is paying off. Praised mom for all the hard work she is putting in.   Infant to follow up with Dr. Winn Jock on December 7. Infant to follow up with Lactation in 2 weeks.     General Information: Mother's reason for visit: Follow up feeding assessment,  IUGR Consult: Follow-up Lactation consultant: Noralee Stain RN,IBCLC Breastfeeding experience: Latching 2 x a day for 10-15 minutes with the # 24 NS Maternal medical conditions: Pregnancy induced hypertension Maternal medications: Pre-natal vitamin, Other(Oxycodone, To start Mini Pill)  Breastfeeding History: Frequency of breast feeding: 2 x a day Duration of feeding: 10-15 minutes  Supplementation: Supplement method: bottle(Enfamil Purple Nipple)         Breast milk volume: 1.5-3 ounces Breast milk frequency: every 1.5- 3 hours   Pump type: Medela pump in style Pump frequency: every 3 hours Pump volume: 4-5 ounces  Infant Output Assessment: Voids per 24 hours: 8+ Urine color: Clear yellow Stools per 24 hours: 8+ Stool color: Yellow  Breast Assessment: Breast: Soft, Compressible, Hyperplasia Nipple: Erect Pain level: 0 Pain interventions: Bra, Breast pump, Nipple shield  Feeding Assessment: Infant oral assessment: Variance Infant oral assessment comment: see note Positioning: Cross cradle(right breast, 15 minutes) Latch: 1 - Repeated attempts needed to sustain latch, nipple held in mouth throughout feeding, stimulation needed to elicit sucking reflex. Audible swallowing: 2 - Spontaneous and intermittent Type of nipple: 2 - Everted at rest and after stimulation Comfort: 2 - Soft/non-tender Hold: 1 - Assistance needed to correctly position infant at breast and maintain latch LATCH score: 8 Latch assessment: Deep Lips flanged: Yes Suck assessment: Displays both Tools: Nipple shield 24 mm Pre-feed weight: 3100 grams Post feed weight: 3130 grams Amount transferred:  30 ml Amount supplemented: 60 ml Ebm via bottle  Additional Feeding Assessment:                                    Totals: Total amount transferred: 30 ml Total supplement given: 60 ml ebm via bottle Total amount pumped post feed: did not pump   Plan: 1. Offer infant the breast with  feeding cues, make sure infant gets  Limit breast feeding to 20 minutes if infant is sleepy at the breast, if feeding is limited at the breast, offer infant supplement of formula or breast milk.  Attempt breast feeding 3-4 x a day.  2. Keep infant awake at the breast as needed with feeding 3. Feed infant skin to skin with breast feeding 4. Massage/compress the breast with feedings as needed to keep infant awake and active at the breast 5. Use the # 24 Nipple Shield with feeding as needed to keep infant latched 5. When giving a bottle, offer using the paced bottle feeding method 6. Infant needs about 58-78 ml (2-2.5 ounces) for 8 feedings a day or 465-620 ml (16-21 ounces) in 24 hours.  Infant may take more or less per feeding depending on how often he feeds. Feed infant until he is satisfied.  7. Would recommend that you pump about 8 times a day for 15-20 minutes with your Double electric breast pump to promote and protect milk supply.  8. Keep up the good work 9. Thank you for allowing me to assist you today 10. Please call with any questions or concerns as needed [phone] 11. Follow up with Lactation in 2 weeks     [place], IBCLC                                                      Donn Pierini 06/20/2019, 9:31 AM

## 2019-07-04 ENCOUNTER — Ambulatory Visit: Payer: Self-pay

## 2019-07-04 NOTE — Lactation Note (Signed)
This note was copied from a baby's chart. Lactation Consultation Note  Patient Name: April Strong. Today's Date: 07/04/2019     07/04/2019  Name: April Strong. MRN: [id number] Date of Birth: 05/25/2020 Gestational Age: Gestational Age: [redacted]w[redacted]d Birth Weight: 92.2 oz Weight today:    Today's weight 7 pounds 14.5 ounces (3586 grams) with clean newborn diaper   48 week old term infant presents with mom and dad for feeding assessment.   Infant has gained 486 grams in the last 14 days with an average daily weight gain of 35 grams a day.   Infant is now feeding without the NS and is nursing for about half of his feedings. Infant is feeding on both breasts with feedings and is eating for about 20 minutes on each breast. Infant does not need supplement after breast feeding. Mom reports some pain with feeding and resolves when she relatches him.   Infant is less sleepy at the breast per mom. Infant is bottle feeding when mom is at work and BF when she is at home.   Mom was not able to pump yesterday at work, she went all day without pumping. She reports she is not able to pump during lunch as she is in a break room with others. Reviewed the [employer] law with mom and gave her a handout to share with her employer. Reviewed pumping in the car on the way to and from work if she is able. She has a Medela PIS. Reviewed ways to have pump while eating with hands free bra. Mom does not have a hands free bra and will look into getting one.   Infant with strong suckle on gloved finger and good tongue cupping. Infant with slightly high palate. Infant with posterior lingual frenulum with some decreased midtongue elevation. Infant sleepy at the breast but has improved on his feedings. He has weaned from the NS, nipple is asymmetrical post feeding, mom with no pain when infant latched well. Infant is less sleepy at the breast. Infant is very gassy per mom. Parents given information on tongue and lip  restrictions and how it can effect growth and milk transfer over time. Parents given website and local provider information. Parents to decide if they want to have infant evaluated.   Helped mom with flanging lips at the feeding. Reviewed stimulating infant as needed with feeding.   Mom concerned about milk supply. She is drinking [employer] daily. Reviewed oatmeal, fluids, calories, Fenugreek or Milk Flow Plus.   Infant to follow up with Dr. Winn Jock at 2 months. Infant to follow up with Lactation in a few weeks at her request.     General Information: Mother's reason for visit: Follow up feeding assessment Consult: Follow-up Lactation consultant: Jasmine December Ballard Budney RN,IBCLC Breastfeeding experience: BF is improving, latching more and weaned off the NS Maternal medical conditions: Pregnancy induced hypertension Maternal medications: Pre-natal vitamin  Breastfeeding History: Frequency of breast feeding: every 3 hours average Duration of feeding: 20-40 minutes, usually uses both breasts wtih each feeding  Supplementation: Supplement method: bottle([place], no choking or drooling noted)         Breast milk volume: 4 ounces Breast milk frequency: 4 x a day when mom at work   Pump type: Medela pump in style Pump frequency: usually 4 x a day, did not pump at work yesterday Pump volume: 2.5-11.5 ounces (larger amount when did not pump all day  Infant Output Assessment: Voids per 24 hours: 8+ Urine color: Clear  yellow Stools per 24 hours: 8 Stool color: Yellow  Breast Assessment: Breast: Soft, Compressible Nipple: Erect Pain level: 3(when infant nursing) Pain interventions: Bra, Breast pump  Feeding Assessment: Infant oral assessment: Variance Infant oral assessment comment: see note Positioning: Cross cradle(right breast, 20 minutes) Latch: 1 - Repeated attempts needed to sustain latch, nipple held in mouth throughout feeding, stimulation needed to elicit sucking reflex. Audible  swallowing: 2 - Spontaneous and intermittent Type of nipple: 2 - Everted at rest and after stimulation Comfort: 1 - Filling, red/small blisters or bruises, mild/mod discomfort Hold: 2 - No assistance needed to correctly position infant at breast LATCH score: 8 Latch assessment: Deep Lips flanged: Yes Suck assessment: Displays both   Pre-feed weight: 3586 grams Post feed weight: 3650 grams Amount transferred: 64 ml Amount supplemented: 0  Additional Feeding Assessment: Infant oral assessment: Variance Infant oral assessment comment: see note Positioning: Cross cradle(left breast, 20 minutes) Latch: 2 - Grasps breast easily, tongue down, lips flanged, rhythmical sucking. Audible swallowing: 2 - Spontaneous and intermittent Type of nipple: 2 - Everted at rest and after stimulation Comfort: 1 - Filling, red/small blisters or bruises, mild/mod discomfort Hold: 2 - No assistance needed to correctly position infant at breast LATCH score: 9 Latch assessment: Deep Lips flanged: No(needed upper and lower lips flanged) Suck assessment: Displays both   Pre-feed weight: 3650 grams Post feed weight: 3686 grams Amount transferred: 36 ml Amount supplemented: 0  Totals: Total amount transferred: 100 ml Total supplement given: 0 Total amount pumped post feed: did not pump   Plan:  1. Offer infant the breast with feeding cues when you are with him 2. Keep infant awake at the breast as needed with feeding 3. Feed infant skin to skin with breast feeding 4. Massage/compress the breast with feedings as needed to keep infant awake and active at the breast 5. When giving a bottle, offer using the paced bottle feeding method 6. Infant needs about66-38ml (2.5-3 ounces) for 8 feedings a day or 525-700 ml (18-23 ounces) in 24 hours.Infant may take more or less per feeding depending on how often he feeds. Feed infant until he is satisfied.  7. Would recommend that you pump anytime infant getting a  bottle when you are away from him with your Double electric breast pump to promote and protect milk supply.  8. Keep up the good work 9. Thank you for allowing me to assist you today 10. Please call with any questions or concerns as needed [phone] 11. Follow up with Lactation in2 weeks   Donn Pierini RN, IBCLC                                                      Debby Freiberg Megin Consalvo 07/04/2019, 9:30 AM

## 2019-07-26 ENCOUNTER — Ambulatory Visit: Payer: Self-pay

## 2019-07-26 NOTE — Lactation Note (Signed)
This note was copied from a baby's chart. Lactation Consultation Note  Patient Name: April Strong. Today's Date: 07/26/2019     07/26/2019  Name: April Strong. MRN: [id number] Date of Birth: 05/25/2020 Gestational Age: Gestational Age: [redacted]w[redacted]d Birth Weight: 92.2 oz Weight today:  Weight: 9 lb 10.6 oz (140 g)   30 month old infant presents today with mom and dad for feeding assessment.   Infant has gained 798 grams in the last 22 days with an average daily weight gain of 36 grams a day.   Infant has oral thrush. Infant has been constipated and went to the hospital yesterday. Infant is being watched by a family member, they were feeding him baby food instead of giving more milk. Mom has asked for them to stop giving baby food and increase milk.   Mom talked with work and was able to get breaks extended to allow for pumping.   Infant is BF when mom is at home and infant is feeding every 2.5-3 hours. Infant usually feeds on one breast per feeding.   Mom reports her nipples started being sore on Saturday. She and infant has been prescribed treatment and mom to pick up today to start treatment. Reviewed Patient Instructions for Care of Ritta Slot for Mother and [employer]. Handout given.   Mom is just making enough milk for infant. She is using [place] [employer] and some drinks and sweets. Reviewed adding another pumping sometime during the day to help with increasing.   Infant to follow up with Dr. Roosvelt Harps on 1/11. Infant to follow up with Lactation as needed. Mom to call with any questions or concerns.    General Information: Mother's reason for visit: Follow up feeding assessment Consult: Follow-up Lactation consultant: Nonah Mattes RN,IBCLC Breastfeeding experience: BF well. infnat and mom has Thrush Maternal medical conditions: Pregnancy induced hypertension Maternal medications: Pre-natal vitamin  Breastfeeding History: Frequency of  breast feeding: every 2.5-3 hours when mom is home Duration of feeding: 15-20 minutes  Supplementation: Supplement method: bottle(Medela bottles with Extra Slow Flow Nipple)         Breast milk volume: 3-5 ounces Breast milk frequency: every 2-3 hours   Pump type: Medela pump in style Pump frequency: every 3-4 hours at work Pump volume: 2 ounces-5 ounces  Infant Output Assessment: Voids per 24 hours: 8 Urine color: Clear yellow Stools per 24 hours: decreased stooling recently and went to hospital for constipation, stooled after suppository Stool color: Green  Breast Assessment: Breast: Soft, Compressible, Hyperplasia Nipple: Erect, Reddened, Other Pain level: 2(infant and mom with thrush, pain sometimes higher) Pain interventions: Bra, Breast pump  Feeding Assessment: Infant oral assessment: Variance Infant oral assessment comment: see note Positioning: Cross cradle(right breast, 15 minutes) Latch: 2 - Grasps breast easily, tongue down, lips flanged, rhythmical sucking. Audible swallowing: 2 - Spontaneous and intermittent Type of nipple: 2 - Everted at rest and after stimulation Comfort: 1 - Filling, red/small blisters or bruises, mild/mod discomfort Hold: 2 - No assistance needed to correctly position infant at breast LATCH score: 9 Latch assessment: Deep Lips flanged: Yes Suck assessment: Displays both   Pre-feed weight: 4384 grams Post feed weight: 4466 grams Amount transferred: 78 ml Amount supplemented: 0  Additional Feeding Assessment:                                    Totals: Total  amount transferred: 78 ml Total supplement given: 0 Total amount pumped post feed: did not pump   Plan:  1. Offer infant the breast with feeding cues when you are with him 2. Keep infant awake at the breast as needed with feeding 3. Feed infant skin to skin with breast feeding 4. Massage/compress the breast with feedings as needed to keep infant awake and  active at the breast 5. When giving a bottle, offer using the paced bottle feeding method 6. Infant needs about81-174ml (3-3.5 ounces) for 8 feedings a day or 645-860 ml (22-29 ounces) in 24 hours.Infant may take more or less per feeding depending on how often he feeds. Feed infant until he is satisfied.  7. Would recommend that you pump anytime infant getting a bottle when you are away from him with your Double electric breast pump to promote and protect milk supply.  8. Try to add in 1 extra pumping during your day or before bed to boost your supply just a little 9. Follow instructions for Treatment of Thrush per handout 10. Keep up the good work 11. Thank you for allowing me to assist you today 12. Please call with any questions or concerns as n1eeded [phone] 13. Follow up with Lactation as needed     Ed Blalock RN, IBCLC                                                       Silas Flood Peter Keyworth 07/26/2019, 8:32 AM

## 2019-08-14 ENCOUNTER — Other Ambulatory Visit (HOSPITAL_COMMUNITY): Payer: Self-pay | Admitting: Lactation Services

## 2019-08-14 ENCOUNTER — Telehealth (HOSPITAL_COMMUNITY): Payer: Self-pay | Admitting: Lactation Services

## 2019-08-14 ENCOUNTER — Ambulatory Visit: Payer: Self-pay

## 2019-08-14 NOTE — Lactation Note (Signed)
Lactation Consultation Note  Patient Name: Minola Guin QTTCN'G Date: 08/14/2019   [facility] without Ibuprofen called into [facility] per Dr. Salomon Mast. Pt aware where to go pick up.    Silas Flood Moncerrat Burnstein 08/14/2019, 11:40 AM

## 2019-08-14 NOTE — Lactation Note (Signed)
This note was copied from a baby's chart. Lactation Consultation Note  Patient Name: April Strong. Today's Date: 08/14/2019     08/14/2019  Name: April Strong. MRN: [id number] Date of Birth: 05/25/2020 Gestational Age: Gestational Age: [redacted]w[redacted]d Birth Weight: 92.2 oz Weight today:  Weight: 10 lb 7.9 oz (7159 g)   104 month old infant presents today with mom for follow up feeding assessment. Mom reports her nipple pain has gotten much worse in the last 24 hours.   Infant has gained 376 grams in the last 19 days with an average daily weight gain of 20 grams a day. Mom aware infant weight gain has decreased since last visit.   Infant is latching to the breast when mom is home about every 2-3 hours feeding once in the middle of the night. Mom is experiencing pain to her nipples that has worsened. Mom is not following yeast treatment plan. Another copy given to mom and encouraged her to follow. APNO ordered per Dr. Salomon Mast. Mom to go to [facility] today to pick up [facility].   Mom is not pumping often due to pain with pumping due to yeast. She reports infant continues to have thrush and she does not have Nystatin. Advised her to call pediatrician for refill.   Mom not able to pump and feed on right breast due to pain . Gave mom #30 flanges to try for the next few days. Cautioned about switching back to #27 flanges asap. Reviewed she cal lose her milk supply if she does not change back. Mom reports increased relief and pumped her right breast in the office.   Infant with strong suckle on gloved finger and good tongue cupping. Infant with slightly high palate. Infant with posterior lingual frenulum with some decreased midtongue elevation.  Mom has pain with feedings, she and infant with thrush.  Infant clicks on the breast. Parents given information on tongue and lip restrictions and how it can effect growth and milk transfer over time. Parents given website and local provider  information. Parents to decide if they want to have infant evaluated.   Infant latched to both breasts with feeding. Mom reports pain to right breast and mom reports less pain today with feeding. She was able to tolerated infant better on the right. Enc mom to feed infant more at night if infant will since weight gain has decreased.   Infant to follow up with Pediatrician for the remainder of his 2 month shots. Mom reports she is awaiting on a call from the office. Infant to follow up with Lactation in 2 weeks at [facility] request. Mom to call with questions or concerns as needed.    General Information: Mother's reason for visit: Follow up feeding assessment, Thrush in infant and mom's breasts Consult: Follow-up Lactation consultant: April December Altonio Schwertner RN,IBCLC Breastfeeding experience: BF well Maternal medical conditions: Pregnancy induced hypertension Maternal medications: Pre-natal vitamin  Breastfeeding History: Frequency of breast feeding: every 2-3 hours, feeds once in the middle of the night Duration of feeding: 20 minutes, feeds on one or both breasts with each feeding  Supplementation: Supplement method: bottle(Purple ring Extra slow flow nipple)         Breast milk volume: 4-6 ounces Breast milk frequency: every 2-3 hours when mom not with infant   Pump type: Medela pump in style Pump frequency: every 3-4 hours while at work Pump volume: 4 ounces  Infant Output Assessment: Voids per 24 hours: 8+   Stools per  24 hours: every other day Stool color: Yellow(pasty)  Breast Assessment: Breast: Soft, Compressible, Hyperplasia Nipple: Erect, Other, Scabs(painful with feeding and pumping due to Thrust/yeast, right nipple excoriated.) Pain level: 10(right breast, 10, left breast less) Pain interventions: Bra, Other(Nystatin, started yesterday, applying after each feeding)  Feeding Assessment: Infant oral assessment: Variance Infant oral assessment comment: see note Positioning:  Cross cradle(left breast, 20 minutes) Latch: 2 - Grasps breast easily, tongue down, lips flanged, rhythmical sucking. Audible swallowing: 2 - Spontaneous and intermittent Type of nipple: 2 - Everted at rest and after stimulation Comfort: 1 - Filling, red/small blisters or bruises, mild/mod discomfort Hold: 2 - No assistance needed to correctly position infant at breast LATCH score: 9 Latch assessment: Deep Lips flanged: Yes Suck assessment: Displays both   Pre-feed weight: 4760 grams Post feed weight: 4804 grams Amount transferred: 44 ml Infant did go back on and latched on the right side to finish feeding.   Additional Feeding Assessment:                                    Totals: Total amount transferred: 44 ml Total supplement given: would not relatch Total amount pumped post feed: 90 ml from right breast   Plan:   1. Offer infant the breast with feeding cues when you are with him 2. Keep infant awake at the breast as needed with feeding 3. Feed infant skin to skin with breast feeding 4. Massage/compress the breast with feedings as needed to keep infant awake and active at the breast 5. When giving a bottle, offer using the paced bottle feeding method 6. Infant needs about88-162ml (3-4 ounces) for 8 feedings a day or 705-940 ml (24-31 ounces) in 24 hours.Infant may take more or less per feeding depending on how often he feeds. Feed infant until he is satisfied.  7. Would recommend that you pump anytime infant getting a bottle when you are away from him with your Double electric breast pump to promote and protect milk supply.  8. Try to add in 1 extra pumping during your day or before bed to boost your supply just a little 9. Use the #30 flanges to the left breast for a few days and then return to the # 27 flanges 10. Follow instructions for Treatment of Thrush per handout. Continue for 1-2 weeks after symptoms are gone 11. Use the All Purpose Nipple Ointment  after each pumping or feeding for 5-7 days and then switch to Lotrimin AF 12. Call Pediatrician for refill on Nystatin 13. Keep up the good work 4. Thank you for allowing me to assist you today 15. Please call with any questions or concerns as n1eeded [phone] 16. Follow up with Lactation in 2 weeks    April Strong, IBCLC                                                   April Strong April Strong 08/14/2019, 11:06 AM

## 2019-08-14 NOTE — Telephone Encounter (Signed)
Opened in error

## 2019-08-28 ENCOUNTER — Ambulatory Visit: Payer: Self-pay

## 2019-08-28 NOTE — Lactation Note (Signed)
This note was copied from a baby's chart. Lactation Consultation Note  Patient Name: April Strong. Today's Date: 08/28/2019     08/28/2019  Name: April Strong. MRN: [id number] Date of Birth: 05/25/2020 Gestational Age: Gestational Age: [redacted]w[redacted]d Birth Weight: 92.2 oz Weight today:  Weight: 11 lb 4.5 oz (1752 g)   59 month old infant presents today with mom for follow up feeding assessment.   Infant has gained 358 grams in the last 14 days with an average daily weight gain of 26 grams a day.   Mom reports her nipple pain has gone away. Infant does have a patch of thrush inside his lower lip. Mom reports she found his Nystatin and plans to retreat him.   Infant is feeding well per mom at the breast. Mom reports infant is being fed by his caregivers and not pace bottle feeding infant. Mom has asked them to pace bottle feed infant. Infant is spitting up at the care givers. He does not spit much for mom.   Mom is pumping with her # 30 flanges and reports she is getting more milk with pumping.   Infant has not stooled in a few day. She has been bicycling legs and tummy time. Infant is getting 1 oz Prune/apple juice daily and it is not helping. Abdomen is not distended. Mom reports infant fussy when passing gas. Enc mom to call and make appt with Peds office.   Infant with dry scaly skin to abdomen, back, buttocks,  and legs is dry and has been his whole life. Mom reports she puts Johnson's lotion on infant. Enc mom to have peds look at skin also. Infant has some between his eyes that is not as noticeable.   Infant is sleeping form 9 pm to 5 am. Reviewed watching milk supply and making sure she pumps before bed and once in the middle of the night. Infant cluster feeds in the morning.   Infant with strong suckle on gloved finger and good tongue cupping.Infant with slightly high palate.Infant with posterior lingual frenulum with some decreased midtongue elevation. Infant  clicks on the breast throughout feeding.  Infant is smiling, cooing and sometimes distracted. Infant is pulling things towards his mouth now. He is wanting to sit up more. Reviewed normalcy of being of distracted at this age and enc a dark quiet room as needed.   Infant to follow up with Dr. Winn Jock at 4 months. Infant has not had his 2 month shots. Mom is awaiting office to call her. Enc mom to call the office and get scheduled. Infant to follow up in 2 weeks at [facility] request.   General Information: Mother's reason for visit: Follow up feeding assessment Consult: Follow-up Lactation consultant: Noralee Stain RN,IBCLC Breastfeeding experience: Feeding well Maternal medical conditions: Pregnancy induced hypertension Maternal medications: Pre-natal vitamin, Other(APNO)  Breastfeeding History: Frequency of breast feeding: every 2-3 hours Duration of feeding: 10-15 minutes, both breasts with each feeding  Supplementation: Supplement method: bottle         Breast milk volume: 3-6+ ounces Breast milk frequency: mom is unsure   Pump type: Medela pump in style Pump frequency: every 2-3 hours Pump volume: 6-7 ounces  Infant Output Assessment: Voids per 24 hours: 8+ Urine color: Clear yellow Stools per 24 hours: not stooled in a few days    Breast Assessment: Breast: Soft, Compressible, Hyperplasia Nipple: Erect Pain level: 0 Pain interventions: Bra, All purpose nipple cream, Breast pump  Feeding Assessment: Infant oral  assessment: Variance Infant oral assessment comment: see note   Latch: 2 - Grasps breast easily, tongue down, lips flanged, rhythmical sucking. Audible swallowing: 2 - Spontaneous and intermittent Type of nipple: 2 - Everted at rest and after stimulation Comfort: 2 - Soft/non-tender Hold: 2 - No assistance needed to correctly position infant at breast LATCH score: 10 Latch assessment: Deep Lips flanged: Yes Suck assessment: Displays both   Pre-feed weight:  5118 grams Post feed weight: 5174 grams Amount transferred: 56 ml Amount supplemented: 0  Additional Feeding Assessment:                                    Totals: Total amount transferred: 56 ml Total supplement given: 0 Total amount pumped post feed: did not pump   Plan:  1. Offer infant the breast with feeding cues when you are with him 2. Keep infant awake at the breast as needed with feeding 3. Feed infant skin to skin with breast feeding 4. Massage/compress the breast with feedings as needed to keep infant awake and active at the breast 5. When giving a bottle, offer using the paced bottle feeding method 6. Infant needs about96-128 ml (3-4 ounces) for 8 feedings a day or [phone] ml (26-34 ounces) in 24 hours.Infant may take more or less per feeding depending on how often he feeds. Feed infant until he is satisfied.  7. Would recommend that you pump anytime infant getting a bottle when you are away from him with your Double electric breast pump to promote and protect milk supply.  Would recommend you pump when infant goes to bed at night and once in the middle of the night to protect milk supply 8. Try to add in 1 extra pumping during your day or before bed to boost your supply just a little 9. Follow instructions for Treatment of Thrush per handout. Continue for 1-2 weeks after symptoms are gone 10. Use the All Purpose Nipple Ointment after each pumping or feeding for 5-7 days and then switch to Lotrimin AF 11. Continue Nystatin as ordered 12. Keep up the good work 3. Thank you for allowing me to assist you today 14. Please call with any questions or concerns as n1eeded [phone]. Follow up with Lactation in 2 weeks  [place], IBCLC                                                      Debby Freiberg April Strong 08/28/2019, 9:01 AM

## 2019-10-10 ENCOUNTER — Ambulatory Visit: Payer: Self-pay

## 2019-10-10 NOTE — Lactation Note (Signed)
This note was copied from a baby's chart. Lactation Consultation Note  Patient Name: April Strong. Today's Date: 10/10/2019     10/10/2019  Name: April Strong. MRN: [id number] Date of Birth: 05/25/2020 Gestational Age: Gestational Age: [redacted]w[redacted]d Birth Weight: 92.2 oz Weight today:  Weight: 13 lb 2.2 oz (8654 g)    27 month old infant presents today with mom for feeding assessment.   Mom is concerned infant is feeding every 30-120 minutes during the day. He is sleeping from 9 pm, wakes up at 12 and then back to sleep until 5 am. Mom reports he does not eat well at 12 mn.    Infant has gained 840 grams in the last 43 days with an average daily weight gain of 20 grams a day.   Mom reports infant feeds for 10 minutes per feeding and then will feed in about 30 minutes. Mom reports she is not feeling as full as she was.mom does not feel infant is draining the breast well with each feeding.   They gave infant a bottle yesterday and he took 1.5 ounces. He does not get bottles often.   Mom reports she is having difficulty with pumping as infant will wake up to the sound of the pump. She is able to hand express about 4 ounces. She is storing the milk as infant usually will not take.   Infant with strong suckle on gloved finger and good tongue cupping.Infant with slightly high palate.Infant with posterior lingual frenulum with some decreased midtongue elevation.Infant clicks on the breast throughout feeding. Overall infant seems to be feeding well and transferring well.   Infant is still distracted with feeding and social. He is talking with mom with feeding and blowing raspberries.  Infant not taking bottles well, reviewed trying a level 2 nipple to see if he will feed better with a little faster flow. Reviewed that if choking or drooling it may be to fast.   Patient reports her Medela pump does not pull well and that with her Avent pump she can only pump once a day.     Infant seems to be a snacker and wants to eat smaller meals more frequently. Infant is very busy and distracted with feeding      General Information: Mother's reason for visit: Follow up feeding assessment Consult: Follow-up Lactation consultant: Nonah Mattes RN,IBCLC Breastfeeding experience: latching every 30 minutes to 2 hours during the day and sleeping most of the night Maternal medical conditions: Pregnancy induced hypertension Maternal medications: Pre-natal vitamin  Breastfeeding History: Frequency of breast feeding: every 30 minutess-2 hours Duration of feeding: 10 minutes, usually on one breast  Supplementation: Supplement method: bottle(Dr. Brown's Level 1)         Breast milk volume: 1.5 ounces Breast milk frequency: 1-2 times a week, not interested   Pump type: Other(Avent DEBP) Pump frequency: once a day, usually hand expresses Pump volume: 4 ounces  Infant Output Assessment: Voids per 24 hours: 8+ Urine color: Clear yellow Stools per 24 hours: every 5-7 days, not using any Milicon or juices now Stool color: Yellow  Breast Assessment: Breast: Soft, Compressible, Hyperplasia Nipple: Erect Pain level: 0 Pain interventions: Bra, Breast pump  Feeding Assessment: Infant oral assessment: Variance Infant oral assessment comment: see note Positioning: Cross cradle Latch: 2 - Grasps breast easily, tongue down, lips flanged, rhythmical sucking. Audible swallowing: 2 - Spontaneous and intermittent Type of nipple: 2 - Everted at rest and after stimulation Comfort: 2 -  Soft/non-tender Hold: 2 - No assistance needed to correctly position infant at breast LATCH score: 10 Latch assessment: Deep Lips flanged: Yes Suck assessment: Displays both   Pre-feed weight: 5958 grams Post feed weight: 6014 grams Amount transferred: 56 ml Amount supplemented: 0  Additional Feeding Assessment:                                    Totals: Total amount  transferred: 56 ml Total supplement given: 0 Total amount pumped post feed: did not pump  1. Offer infant the breast with feeding cues when you are with him 2. Keep infant awake at the breast as needed with feeding 3. Feed infant skin to skin with breast feeding 4. Massage/compress the breast with feedings as needed to keep infant awake and active at the breast 5. When giving a bottle, offer using the paced bottle feeding method 6. Infant needs about111-139ml (4-5 ounces) for 8 feedings a day or [phone] ml (30+ ounces) in 24 hours.Infant may take more or less per feeding depending on how often he feeds. Feed infant until he is satisfied.  7. Would recommend that you pump anytime infant getting a bottle when you are away from him with your Double electric breast pump to promote and protect milk supply.  Would recommend you pump when infant goes to bed at night and once in the middle of the night to protect milk supply 8. Offer infant a bottle once a day until he is better at taking them regularly, Try the level 2 nipple  9. Try to add in 1 extra pumping during your day or before bed to boost your supply just a little 10. Keep up the good work 11. Thank you for allowing me to assist you today 12. Please call with any questions or concerns as needed [phone] 13. Follow up with Lactation as needed    Ed Blalock RN, IBCLC                                                   Ed Blalock 10/10/2019, 1:47 PM

## 2023-12-17 ENCOUNTER — Ambulatory Visit (INDEPENDENT_AMBULATORY_CARE_PROVIDER_SITE_OTHER): Admitting: Family Medicine

## 2023-12-17 ENCOUNTER — Other Ambulatory Visit: Payer: Self-pay

## 2023-12-17 VITALS — BP 120/76 | HR 81 | Ht 59.0 in | Wt 159.3 lb

## 2023-12-17 DIAGNOSIS — Z3200 Encounter for pregnancy test, result unknown: Secondary | ICD-10-CM | POA: Insufficient documentation

## 2023-12-17 DIAGNOSIS — Z3201 Encounter for pregnancy test, result positive: Secondary | ICD-10-CM

## 2023-12-17 LAB — POCT PREGNANCY, URINE: Preg Test, Ur: POSITIVE — AB

## 2023-12-17 MED ORDER — PRENATAL VITAMIN 27-0.8 MG PO TABS: 1.0000 | ORAL_TABLET | Freq: Every day | ORAL | 11 refills | Status: AC

## 2023-12-17 NOTE — Patient Instructions (Signed)

## 2023-12-17 NOTE — Progress Notes (Signed)
 Pt here today for urine pregnancy test, resulting positive.  Her LMP was 11/12/23, making her 5 weeks today.   EDD of 08/18/24. Pt denies any pain or vaginal bleeding at this time. Advised pt to initiate prenatal care around 10 weeks.  List of safe medications during pregnancy and OBGYN providers in area provided.  PNV Rx sent to pt pharmacy.  Advised if she has any concerns for persistent pain, vaginal bleeding to be assessed at [facility]. Pt verbalized understanding, no further questions.  Carolynne Citron, RN

## 2023-12-17 NOTE — Progress Notes (Signed)
  Amenorrhea with positive UPT PROBLEM  VISIT ENCOUNTER NOTE  Subjective:    April Strong is a 28 y.o. G17P1001  female here for positive pregnancy test.  She has not had serial bhcg. She has not an US .   Patient is currently approximately [redacted] weeks pregnant.  Reports history of gestational hypertension and IUGR in previous pregnancy.  History of C-section but desires trial of labor.  Unsure where she is going to get care.  Lab results @[facility] (HCGBETAQNT:6)]   Denies abnormal vaginal bleeding, discharge, pelvic pain, problems with intercourse or other gynecologic concerns.    Gynecologic History Patient's last menstrual period was 11/12/2023 (exact date).  Health Maintenance Due  Topic Date Due   Hepatitis C Screening  Never done   DTaP/Tdap/Td (1 - Tdap) Never done   Cervical Cancer Screening (Pap smear)  Never done   COVID-19 Vaccine (1 - 2024-25 season) Never done    The following portions of the patient's history were reviewed and updated as appropriate: allergies, current medications, past family history, past medical history, past social history, past surgical history and problem list.  Review of Systems Pertinent items are noted in HPI.   Objective:  BP 120/76   Pulse 81   Ht 4\' 11"  (1.499 m)   Wt 159 lb 4.8 oz (72.3 kg)   LMP 11/12/2023 (Exact Date)   Breastfeeding No   BMI 32.17 kg/m  Gen: well appearing, NAD HEENT: no scleral icterus CV: RR Lung: Normal WOB Ext: warm well perfused   Assessment and Plan:  1. Possible pregnancy (Primary) Positive pregnancy test.  Welcomed to the clinic.  Discussed starting aspirin at 12 weeks.  Discussed prenatal vitamins.  No further questions or concerns. - Pregnancy, urine POC - Prenatal Vit-Fe Fumarate-FA (PRENATAL VITAMIN) 27-0.8 MG TABS; Take 1 tablet by mouth daily.  Dispense: 30 tablet; Refill: 11    Please refer to After Visit Summary for other counseling recommendations.   No follow-ups on file.  April Strong  April Glorimar Stroope, MD Attending Physician Faculty Practice- Center for [facility]

## 2024-01-18 ENCOUNTER — Telehealth

## 2024-01-18 DIAGNOSIS — Z3480 Encounter for supervision of other normal pregnancy, unspecified trimester: Secondary | ICD-10-CM | POA: Insufficient documentation

## 2024-01-18 DIAGNOSIS — Z3A09 9 weeks gestation of pregnancy: Secondary | ICD-10-CM | POA: Diagnosis not present

## 2024-01-18 DIAGNOSIS — O0990 Supervision of high risk pregnancy, unspecified, unspecified trimester: Secondary | ICD-10-CM | POA: Insufficient documentation

## 2024-01-18 DIAGNOSIS — Z3481 Encounter for supervision of other normal pregnancy, first trimester: Secondary | ICD-10-CM | POA: Diagnosis not present
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
## 2024-01-24 ENCOUNTER — Other Ambulatory Visit: Payer: Self-pay

## 2024-01-24 DIAGNOSIS — O3680X0 Pregnancy with inconclusive fetal viability, not applicable or unspecified: Secondary | ICD-10-CM

## 2024-01-25 ENCOUNTER — Other Ambulatory Visit (HOSPITAL_COMMUNITY)
Admission: RE | Admit: 2024-01-25 | Discharge: 2024-01-25 | Disposition: A | Discharge status: Home / Self Care | Source: Ambulatory Visit | Attending: Obstetrics and Gynecology | Admitting: Obstetrics and Gynecology

## 2024-01-25 ENCOUNTER — Ambulatory Visit (INDEPENDENT_AMBULATORY_CARE_PROVIDER_SITE_OTHER): Payer: Self-pay | Admitting: Obstetrics and Gynecology

## 2024-01-25 ENCOUNTER — Other Ambulatory Visit: Payer: Self-pay

## 2024-01-25 ENCOUNTER — Encounter: Payer: Self-pay | Admitting: Obstetrics and Gynecology

## 2024-01-25 ENCOUNTER — Other Ambulatory Visit

## 2024-01-25 VITALS — BP 124/86 | HR 102 | Wt 161.2 lb

## 2024-01-25 DIAGNOSIS — Z3687 Encounter for antenatal screening for uncertain dates: Secondary | ICD-10-CM

## 2024-01-25 DIAGNOSIS — O133 Gestational [pregnancy-induced] hypertension without significant proteinuria, third trimester: Secondary | ICD-10-CM | POA: Diagnosis not present

## 2024-01-25 DIAGNOSIS — Z2839 Other underimmunization status: Secondary | ICD-10-CM

## 2024-01-25 DIAGNOSIS — O99810 Abnormal glucose complicating pregnancy: Secondary | ICD-10-CM | POA: Diagnosis not present

## 2024-01-25 DIAGNOSIS — O09891 Supervision of other high risk pregnancies, first trimester: Secondary | ICD-10-CM

## 2024-01-25 DIAGNOSIS — O131 Gestational [pregnancy-induced] hypertension without significant proteinuria, first trimester: Secondary | ICD-10-CM | POA: Diagnosis not present

## 2024-01-25 DIAGNOSIS — O3680X0 Pregnancy with inconclusive fetal viability, not applicable or unspecified: Secondary | ICD-10-CM | POA: Diagnosis not present

## 2024-01-25 DIAGNOSIS — O34219 Maternal care for unspecified type scar from previous cesarean delivery: Secondary | ICD-10-CM

## 2024-01-25 DIAGNOSIS — Z3A10 10 weeks gestation of pregnancy: Secondary | ICD-10-CM | POA: Diagnosis not present

## 2024-01-25 DIAGNOSIS — O365931 Maternal care for other known or suspected poor fetal growth, third trimester, fetus 1: Secondary | ICD-10-CM

## 2024-01-25 DIAGNOSIS — Z01419 Encounter for gynecological examination (general) (routine) without abnormal findings: Secondary | ICD-10-CM

## 2024-01-25 DIAGNOSIS — O09899 Supervision of other high risk pregnancies, unspecified trimester: Secondary | ICD-10-CM

## 2024-01-25 DIAGNOSIS — Z3A11 11 weeks gestation of pregnancy: Secondary | ICD-10-CM | POA: Diagnosis not present

## 2024-01-25 DIAGNOSIS — Z3481 Encounter for supervision of other normal pregnancy, first trimester: Secondary | ICD-10-CM | POA: Diagnosis not present

## 2024-01-25 DIAGNOSIS — O99011 Anemia complicating pregnancy, first trimester: Secondary | ICD-10-CM | POA: Diagnosis not present

## 2024-01-25 DIAGNOSIS — Z3480 Encounter for supervision of other normal pregnancy, unspecified trimester: Secondary | ICD-10-CM | POA: Diagnosis not present

## 2024-01-25 DIAGNOSIS — Z3182 Encounter for Rh incompatibility status: Secondary | ICD-10-CM | POA: Diagnosis not present

## 2024-01-25 MED ORDER — BLOOD PRESSURE KIT DEVI: 1.0000 | 0 refills | Status: DC | PRN

## 2024-01-25 MED ORDER — GOJJI WEIGHT SCALE MISC: 1.0000 | 0 refills | Status: DC | PRN

## 2024-01-25 NOTE — Patient Instructions (Signed)

## 2024-01-25 NOTE — Progress Notes (Addendum)
 Subjective:   April Strong is a 28 y.o. G3P1011 at [redacted]w[redacted]d by LMP being seen today for her first obstetrical visit.  Her obstetrical history is significant for intrauterine growth restriction (IUGR), pregnancy induced hypertension, and Hx of c-section. Patient does intend to breast feed. Pregnancy history fully reviewed.  Patient reports no complaints.  HISTORY: OB History  Gravida Para Term Preterm AB Living  3 1 1  0 1 1  SAB IAB Ectopic Multiple Live Births  1 0 0 0 1    # Outcome Date GA Lbr Len/2nd Weight Sex Type Anes PTL Lv  3 Current           2 SAB 03/2022          1 Term 05/25/19 [redacted]w[redacted]d  5 lb 12.2 oz (2.615 kg) M CS-LTranv EPI  LIV     Name: Haymore,BOY Carlyn     Apgar1: 9  Apgar5: 9    No hx of pap smear.  Past Medical History:  Diagnosis Date   Gestational hypertension    Hypertension    Medical history non-contributory    Past Surgical History:  Procedure Laterality Date   CESAREAN SECTION N/A 05/25/2019   Procedure: CESAREAN SECTION;  Surgeon: Alger Gong, MD;  Location: [facility] LD ORS;  Service: Obstetrics;  Laterality: N/A;   LEFT OOPHORECTOMY     benign cyst   SALPINGECTOMY Left    No family history on file. Social History   Tobacco Use   Smoking status: Never   Smokeless tobacco: Never  Vaping Use   Vaping status: Never Used  Substance Use Topics   Alcohol use: No   Drug use: No   No Known Allergies Current Outpatient Medications on File Prior to Visit  Medication Sig Dispense Refill   Prenatal Vit-Fe Fumarate-FA (PRENATAL VITAMIN) 27-0.8 MG TABS Take 1 tablet by mouth daily. 30 tablet 11   Prenatal Vit-Fe Fumarate-FA (PRENATAL VITAMINS PO) Take by mouth. (Patient not taking: Reported on 01/25/2024)     No current facility-administered medications on file prior to visit.     Exam   Vitals:   01/25/24 0917  BP: 124/86  Pulse: (!) 102  Weight: 161 lb 3.2 oz (73.1 kg)   Fetal Heart Rate (bpm): 167  Uterus:     Pelvic Exam:  Perineum: no hemorrhoids, normal perineum   Vulva: normal external genitalia, no lesions   Vagina:  normal mucosa, normal discharge   Cervix: no lesions and normal, pap smear done.    Adnexa: normal adnexa and no mass, fullness, tenderness   Bony Pelvis: average  System: General: well-developed, well-nourished female in no acute distress   Skin: normal coloration and turgor, no rashes   Neurologic: oriented, normal, negative, normal mood   Extremities: normal strength, tone, and muscle mass, ROM of all joints is normal   HEENT PERRLA, extraocular movement intact and sclera clear, anicteric   Mouth/Teeth mucous membranes moist, pharynx normal without lesions and dental hygiene good   Neck supple and no masses   Cardiovascular: regular rate and rhythm   Respiratory:  no respiratory distress, normal breath sounds   Abdomen: soft, non-tender; bowel sounds normal; no masses,  no organomegaly     Assessment:   Pregnancy: G3P1011 Patient Active Problem List   Diagnosis Date Noted   [redacted] weeks gestation of pregnancy 01/25/2024   Hx of cesarean section complicating pregnancy 01/25/2024   Supervision of other normal pregnancy, antepartum 01/18/2024   Possible pregnancy 12/17/2023  Chorioamnionitis 05/25/2019   Hx of Gestational hypertension 05/25/2019   Hx of IUGR (intrauterine growth restriction) 05/23/2019     Plan:  1. [redacted] weeks gestation of pregnancy (Primary) 2. Supervision of other normal pregnancy, antepartum  3. Hx of Gestational hypertension Baseline labs   4. Hx of cesarean section complicating pregnancy TOLAC desired.  Discussed ineligible for water birth given hx of CS; disappointed but understanding  5. Hx of IUGR (intrauterine growth restriction) Growth at 32 weeks  Pap smear performed. Initial labs drawn. Continue prenatal vitamins. Genetic Screening discussed, First trimester screen, Quad screen, and NIPS: ordered. Ultrasound discussed; fetal anatomic survey:  ordered. Problem list reviewed and updated. The nature of [facility] Faculty Practice with multiple MDs and other Advanced Practice Providers was explained to patient; also emphasized that residents, students are part of our team. Routine obstetric precautions reviewed. Return in about 4 weeks (around 02/22/2024), or LROB.  Mardy Shropshire, MD FMOB Fellow, Faculty practice [facility], Center for [facility]   After review of lab work, the following dx added to pregnancy:  Mild normocytic anemia affecting pregnancy in first trimester - recommend oral iron supplementation  Prediabetes in mother during pregnancy - early OGTT at 24 weeks recommended.  Rubella non-immune status, antepartum - postpartum MMR recommended  Mardy Shropshire, MD FMOB Fellow, Faculty practice [facility], Center for [facility] 10/Jul/2025 @ 2131

## 2024-01-26 LAB — CBC/D/PLT+RPR+RH+ABO+RUBIGG...
Antibody Screen: NEGATIVE
Basophils Absolute: 0 x10E3/uL (ref 0.0–0.2)
Basos: 1 %
EOS (ABSOLUTE): 0.1 x10E3/uL (ref 0.0–0.4)
Eos: 1 %
HCV Ab: NONREACTIVE
HIV Screen 4th Generation wRfx: NONREACTIVE
Hematocrit: 31.7 % — ABNORMAL LOW (ref 34.0–46.6)
Hemoglobin: 9.7 g/dL — ABNORMAL LOW (ref 11.1–15.9)
Hepatitis B Surface Ag: NEGATIVE
Immature Grans (Abs): 0 x10E3/uL (ref 0.0–0.1)
Immature Granulocytes: 0 %
Lymphocytes Absolute: 2.1 x10E3/uL (ref 0.7–3.1)
Lymphs: 26 %
MCH: 24.9 pg — ABNORMAL LOW (ref 26.6–33.0)
MCHC: 30.6 g/dL — ABNORMAL LOW (ref 31.5–35.7)
MCV: 81 fL (ref 79–97)
Monocytes Absolute: 0.5 x10E3/uL (ref 0.1–0.9)
Monocytes: 6 %
Neutrophils Absolute: 5.4 x10E3/uL (ref 1.4–7.0)
Neutrophils: 66 %
Platelets: 441 x10E3/uL (ref 150–450)
RBC: 3.9 x10E6/uL (ref 3.77–5.28)
RDW: 18.7 % — ABNORMAL HIGH (ref 11.7–15.4)
RPR Ser Ql: NONREACTIVE
Rh Factor: POSITIVE
Rubella Antibodies, IGG: <0.9 {index} — ABNORMAL LOW (ref >0.99)
WBC: 8.1 x10E3/uL (ref 3.4–10.8)

## 2024-01-26 LAB — COMPREHENSIVE METABOLIC PANEL WITH GFR
ALT: 9 IU/L (ref 0–32)
AST: 11 IU/L (ref 0–40)
Albumin: 3.8 g/dL — ABNORMAL LOW (ref 4.0–5.0)
Alkaline Phosphatase: 40 IU/L — ABNORMAL LOW (ref 44–121)
BUN/Creatinine Ratio: 7 — ABNORMAL LOW (ref 9–23)
BUN: 5 mg/dL — ABNORMAL LOW (ref 6–20)
Bilirubin Total: <0.2 mg/dL (ref 0.0–1.2)
CO2: 18 mmol/L — ABNORMAL LOW (ref 20–29)
Calcium: 9.4 mg/dL (ref 8.7–10.2)
Chloride: 100 mmol/L (ref 96–106)
Creatinine, Ser: 0.7 mg/dL (ref 0.57–1.00)
Globulin, Total: 3.3 g/dL (ref 1.5–4.5)
Glucose: 104 mg/dL — ABNORMAL HIGH (ref 70–99)
Potassium: 4.3 mmol/L (ref 3.5–5.2)
Sodium: 134 mmol/L (ref 134–144)
Total Protein: 7.1 g/dL (ref 6.0–8.5)
eGFR: 120 mL/min/1.73 (ref >59)

## 2024-01-26 LAB — HEMOGLOBIN A1C
Est. average glucose Bld gHb Est-mCnc: 123 mg/dL
Hgb A1c MFr Bld: 5.9 % — ABNORMAL HIGH (ref 4.8–5.6)

## 2024-01-26 LAB — PROTEIN / CREATININE RATIO, URINE
Creatinine, Urine: 317.3 mg/dL
Protein, Ur: 20.6 mg/dL
Protein/Creat Ratio: 65 mg/g{creat} (ref 0–200)

## 2024-01-26 LAB — HCV INTERPRETATION

## 2024-01-26 LAB — TSH RFX ON ABNORMAL TO FREE T4: TSH: 1.94 u[IU]/mL (ref 0.450–4.500)

## 2024-01-27 DIAGNOSIS — O99019 Anemia complicating pregnancy, unspecified trimester: Secondary | ICD-10-CM | POA: Insufficient documentation

## 2024-01-27 DIAGNOSIS — O09899 Supervision of other high risk pregnancies, unspecified trimester: Secondary | ICD-10-CM | POA: Insufficient documentation

## 2024-01-27 DIAGNOSIS — O99810 Abnormal glucose complicating pregnancy: Secondary | ICD-10-CM | POA: Insufficient documentation

## 2024-01-27 LAB — CULTURE, OB URINE

## 2024-01-27 LAB — URINE CULTURE, OB REFLEX

## 2024-01-29 DIAGNOSIS — Z419 Encounter for procedure for purposes other than remedying health state, unspecified: Secondary | ICD-10-CM | POA: Diagnosis not present

## 2024-01-31 LAB — CYTOLOGY - PAP
Chlamydia: NEGATIVE
Comment: NEGATIVE
Comment: NEGATIVE
Comment: NEGATIVE
Comment: NEGATIVE
Comment: NORMAL
Diagnosis: NEGATIVE
HPV 16: NEGATIVE
HPV 18 / 45: NEGATIVE
High risk HPV: POSITIVE — AB
Neisseria Gonorrhea: NEGATIVE

## 2024-01-31 LAB — PANORAMA PRENATAL TEST FULL PANEL:PANORAMA TEST PLUS 5 ADDITIONAL MICRODELETIONS: FETAL FRACTION: 8.8

## 2024-02-02 ENCOUNTER — Ambulatory Visit: Payer: Self-pay | Admitting: Obstetrics and Gynecology

## 2024-02-09 LAB — HORIZON CUSTOM: REPORT SUMMARY: NEGATIVE

## 2024-02-22 ENCOUNTER — Other Ambulatory Visit: Payer: Self-pay

## 2024-02-22 ENCOUNTER — Ambulatory Visit: Admitting: Family Medicine

## 2024-02-22 VITALS — BP 119/78 | HR 94 | Wt 163.0 lb

## 2024-02-22 DIAGNOSIS — Z2839 Other underimmunization status: Secondary | ICD-10-CM | POA: Diagnosis not present

## 2024-02-22 DIAGNOSIS — Z3A15 15 weeks gestation of pregnancy: Secondary | ICD-10-CM | POA: Diagnosis not present

## 2024-02-22 DIAGNOSIS — O133 Gestational [pregnancy-induced] hypertension without significant proteinuria, third trimester: Secondary | ICD-10-CM

## 2024-02-22 DIAGNOSIS — O99810 Abnormal glucose complicating pregnancy: Secondary | ICD-10-CM | POA: Diagnosis not present

## 2024-02-22 DIAGNOSIS — R87810 Cervical high risk human papillomavirus (HPV) DNA test positive: Secondary | ICD-10-CM | POA: Diagnosis not present

## 2024-02-22 DIAGNOSIS — O34219 Maternal care for unspecified type scar from previous cesarean delivery: Secondary | ICD-10-CM | POA: Diagnosis not present

## 2024-02-22 DIAGNOSIS — O09899 Supervision of other high risk pregnancies, unspecified trimester: Secondary | ICD-10-CM

## 2024-02-22 DIAGNOSIS — O132 Gestational [pregnancy-induced] hypertension without significant proteinuria, second trimester: Secondary | ICD-10-CM

## 2024-02-22 DIAGNOSIS — O09892 Supervision of other high risk pregnancies, second trimester: Secondary | ICD-10-CM

## 2024-02-22 DIAGNOSIS — Z3480 Encounter for supervision of other normal pregnancy, unspecified trimester: Secondary | ICD-10-CM

## 2024-02-22 MED ORDER — ASPIRIN 81 MG PO TBEC: 81.0000 mg | DELAYED_RELEASE_TABLET | Freq: Every day | ORAL | 3 refills | Status: AC

## 2024-02-22 NOTE — Progress Notes (Signed)
   PRENATAL VISIT NOTE  Subjective:  April Strong is a 28 y.o. G3P1011 at [redacted]w[redacted]d being seen today for ongoing prenatal care.  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  Patient reports no complaints.  Contractions: Not present. Vag. Bleeding: None.  Movement: Absent. Denies leaking of fluid.   The following portions of the patient's history were reviewed and updated as appropriate: allergies, current medications, past family history, past medical history, past social history, past surgical history and problem list.   Objective:    Vitals:   02/22/24 0915  BP: 119/78  Pulse: 94  Weight: 163 lb (73.9 kg)    Fetal Status:  Fetal Heart Rate (bpm): 165   Movement: Absent    General: Alert, oriented and cooperative. Patient is in no acute distress.  Skin: Skin is warm and dry. No rash noted.   Cardiovascular: Normal heart rate noted  Respiratory: Normal respiratory effort, no problems with respiration noted  Abdomen: Soft, gravid, appropriate for gestational age.  Pain/Pressure: Absent     Pelvic: Cervical exam deferred        Extremities: Normal range of motion.  Edema: None  Mental Status: Normal mood and affect. Normal behavior. Normal judgment and thought content.   Assessment and Plan:  Pregnancy: G3P1011 at [redacted]w[redacted]d 1. Hx of Gestational hypertension (Primary) Begin baby ASA - aspirin  EC 81 MG tablet; Take 1 tablet (81 mg total) by mouth daily.  Dispense: 90 tablet; Refill: 3  2. Supervision of other normal pregnancy, antepartum Anatomy u/s and AFP is needed - US  MFM  OB DETAIL +14 WK; Future - AFP, Serum, Open Spina Bifida; Future  3. Rubella non-immune status, antepartum MMR pp  4. Hx of cesarean section complicating pregnancy Desires TOLAC  5. [redacted] weeks gestation of pregnancy   6. 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 Cervical high risk HPV (human papillomavirus) test positive Repeat pap in 1 year  General obstetric precautions including but not limited to vaginal bleeding, contractions, leaking of fluid and fetal movement were reviewed in detail with the patient. Please refer to After Visit Summary for other counseling recommendations.   Return in 4 weeks (on 03/21/2024) for Needs 2 hour ASAP + AFP.  Future Appointments  Date Time Provider Department Center  02/28/2024  9:20 AM [facility]  03/14/2024  9:00 AM [facility] PROVIDER 1 [facility]  03/14/2024  9:30 AM [facility] US6 [facility]  03/22/2024 10:15 AM Fredirick Glenys RAMAN, MD [facility]    Glenys RAMAN Fredirick, MD

## 2024-02-28 ENCOUNTER — Other Ambulatory Visit

## 2024-02-28 ENCOUNTER — Other Ambulatory Visit: Payer: Self-pay

## 2024-02-28 DIAGNOSIS — Z3480 Encounter for supervision of other normal pregnancy, unspecified trimester: Secondary | ICD-10-CM

## 2024-02-28 DIAGNOSIS — O99810 Abnormal glucose complicating pregnancy: Secondary | ICD-10-CM

## 2024-02-29 DIAGNOSIS — Z419 Encounter for procedure for purposes other than remedying health state, unspecified: Secondary | ICD-10-CM | POA: Diagnosis not present

## 2024-03-01 LAB — AFP, SERUM, OPEN SPINA BIFIDA
AFP MoM: 0.56
AFP Value: 20.2 ng/mL
Gest. Age on Collection Date: 16.4 wk
Maternal Age At EDD: 29.8 a
OSBR Risk 1 IN: 10000
Test Results:: NEGATIVE
Weight: 163 [lb_av]

## 2024-03-01 LAB — GLUCOSE TOLERANCE, 2 HOURS W/ 1HR
Glucose, 1 hour: 165 mg/dL (ref 70–179)
Glucose, 2 hour: 162 mg/dL — ABNORMAL HIGH (ref 70–152)
Glucose, Fasting: 78 mg/dL (ref 70–91)

## 2024-03-02 ENCOUNTER — Ambulatory Visit: Payer: Self-pay | Admitting: Family Medicine

## 2024-03-02 DIAGNOSIS — O24419 Gestational diabetes mellitus in pregnancy, unspecified control: Secondary | ICD-10-CM | POA: Insufficient documentation

## 2024-03-02 DIAGNOSIS — Z3480 Encounter for supervision of other normal pregnancy, unspecified trimester: Secondary | ICD-10-CM

## 2024-03-02 DIAGNOSIS — O24410 Gestational diabetes mellitus in pregnancy, diet controlled: Secondary | ICD-10-CM | POA: Insufficient documentation

## 2024-03-06 MED ORDER — ACCU-CHEK SOFTCLIX LANCETS MISC: 12 refills | Status: AC

## 2024-03-06 MED ORDER — ACCU-CHEK GUIDE TEST VI STRP: ORAL_STRIP | 12 refills | Status: AC

## 2024-03-06 MED ORDER — ACCU-CHEK GUIDE W/DEVICE KIT: 1.0000 | PACK | 0 refills | Status: DC | PRN

## 2024-03-06 NOTE — Telephone Encounter (Signed)
-----   Message from Glenys GORMAN Birk sent at 03/02/2024  9:00 PM EDT ----- Has GDM Please send in supplies and refer to D & N mgmt ----- Message ----- From: Rebecka Memos Lab Results In Sent: 03/01/2024  12:35 AM EDT To: Glenys GORMAN Birk, MD

## 2024-03-06 NOTE — Telephone Encounter (Signed)
 I called and left a message that I am calling with results and to make an appointment but since I did not reach you; I will leave a detailed Mychart message for you to read and respond to.  Scheduled for first available education appointment for 03/09/24 at 10:15. Will send in diabetes supplies. Will send message to patient. Will leave in in box for staff to follow up she read message. Rock Skip PEAK

## 2024-03-08 NOTE — Progress Notes (Unsigned)
?  referral Patient was seen for *** (Gestational Diabetes/Pre-existing Diabetes During Pregnancy) on ***  Start time *** and End time ***   Estimated due date: 08/10/2024; ***w***d  Clinical: Medications: *** Medical History: *** Labs: OGTT fasting ***, 1 hour ***, 2 hour *** on ***, A1c ***% on ***  Dietary and Lifestyle History: ***  Physical Activity: *** Stress: *** Sleep: ***  24 hr Recall:  First Meal:  *** Snack:  *** Second meal:  *** Snack:  *** Third meal:  *** Snack:  *** Beverages:  ***  NUTRITION INTERVENTION  Nutrition education (E-1) on the following topics:   Initial Follow-up  []  []  Definition of Gestational Diabetes []  []  Why dietary management is important in controlling blood glucose []  []  Effects each nutrient has on blood glucose levels []  []  Simple carbohydrates vs complex carbohydrates []  []  Fluid intake []  []  Creating a balanced meal plan []  []  Carbohydrate counting  []  []  When to check blood glucose levels []  []  Proper blood glucose monitoring techniques []  []  Effect of stress and stress reduction techniques  []  []  Exercise effect on blood glucose levels, appropriate exercise during pregnancy []  []  Importance of limiting caffeine and abstaining from alcohol and smoking []  []  Medications used for blood sugar control during pregnancy []  []  Hypoglycemia and rule of 15 []  []  Postpartum self care  Blood glucose monitor given: *** Lot # *** Exp: *** CBG: *** mg/dL  *** Patient has a meter prior to visit. Patient is *** testing pre breakfast and 2 hours after each meal. FBS: *** Postprandial: ***  Patient instructed to monitor glucose levels: FBS: 60 - <= 95 mg/dL; 2 hour: <= 879 mg/dL  Patient received handouts: Nutrition Diabetes and Pregnancy Carbohydrate Counting List Blood glucose log Snack ideas for diabetes during pregnancy  Patient will be seen for follow-up as needed.

## 2024-03-09 ENCOUNTER — Encounter: Attending: Family Medicine | Admitting: Dietician

## 2024-03-09 ENCOUNTER — Other Ambulatory Visit: Payer: Self-pay

## 2024-03-09 ENCOUNTER — Ambulatory Visit (INDEPENDENT_AMBULATORY_CARE_PROVIDER_SITE_OTHER): Admitting: Dietician

## 2024-03-09 DIAGNOSIS — Z713 Dietary counseling and surveillance: Secondary | ICD-10-CM | POA: Insufficient documentation

## 2024-03-09 DIAGNOSIS — Z3A18 18 weeks gestation of pregnancy: Secondary | ICD-10-CM | POA: Insufficient documentation

## 2024-03-09 DIAGNOSIS — O24414 Gestational diabetes mellitus in pregnancy, insulin controlled: Secondary | ICD-10-CM

## 2024-03-09 DIAGNOSIS — O24419 Gestational diabetes mellitus in pregnancy, unspecified control: Secondary | ICD-10-CM | POA: Diagnosis not present

## 2024-03-14 ENCOUNTER — Ambulatory Visit (HOSPITAL_BASED_OUTPATIENT_CLINIC_OR_DEPARTMENT_OTHER)

## 2024-03-14 ENCOUNTER — Ambulatory Visit: Attending: Obstetrics and Gynecology | Admitting: Obstetrics and Gynecology

## 2024-03-14 ENCOUNTER — Other Ambulatory Visit: Payer: Self-pay | Admitting: *Deleted

## 2024-03-14 VITALS — BP 126/72 | HR 111

## 2024-03-14 DIAGNOSIS — O24410 Gestational diabetes mellitus in pregnancy, diet controlled: Secondary | ICD-10-CM

## 2024-03-14 DIAGNOSIS — O24419 Gestational diabetes mellitus in pregnancy, unspecified control: Secondary | ICD-10-CM

## 2024-03-14 DIAGNOSIS — O09292 Supervision of pregnancy with other poor reproductive or obstetric history, second trimester: Secondary | ICD-10-CM | POA: Insufficient documentation

## 2024-03-14 DIAGNOSIS — Z3A18 18 weeks gestation of pregnancy: Secondary | ICD-10-CM | POA: Insufficient documentation

## 2024-03-14 DIAGNOSIS — D259 Leiomyoma of uterus, unspecified: Secondary | ICD-10-CM | POA: Diagnosis not present

## 2024-03-14 DIAGNOSIS — O99012 Anemia complicating pregnancy, second trimester: Secondary | ICD-10-CM | POA: Diagnosis not present

## 2024-03-14 DIAGNOSIS — O34219 Maternal care for unspecified type scar from previous cesarean delivery: Secondary | ICD-10-CM | POA: Insufficient documentation

## 2024-03-14 DIAGNOSIS — Z3480 Encounter for supervision of other normal pregnancy, unspecified trimester: Secondary | ICD-10-CM

## 2024-03-14 DIAGNOSIS — O99212 Obesity complicating pregnancy, second trimester: Secondary | ICD-10-CM | POA: Diagnosis not present

## 2024-03-14 DIAGNOSIS — Z2839 Other underimmunization status: Secondary | ICD-10-CM

## 2024-03-14 DIAGNOSIS — O99810 Abnormal glucose complicating pregnancy: Secondary | ICD-10-CM

## 2024-03-14 DIAGNOSIS — Z363 Encounter for antenatal screening for malformations: Secondary | ICD-10-CM | POA: Insufficient documentation

## 2024-03-14 DIAGNOSIS — O3412 Maternal care for benign tumor of corpus uteri, second trimester: Secondary | ICD-10-CM | POA: Diagnosis not present

## 2024-03-14 NOTE — Progress Notes (Signed)
 Maternal-Fetal Medicine Consultation Name: April Strong MRN: [id number]  G3 P1011 at 18w 5d gestation. Patient is here for fetal anatomy scan. On cell-free fetal DNA screening, the risks of aneuploidies are not increased. MSAFP screening showed low risk for open-neural tube defects. Obstetric history significant for a term cesarean delivery in 2020 of a female infant weighing 5 pounds and 12 ounces at birth.  Her pregnancy was complicated by fetal growth restriction and gestational hypertension.  Patient had induction of labor at [redacted] weeks gestation.  Cesarean section was performed because of arrest of dilation.  Her son is in good health. Postoperatively patient had blood transfusion because of anemia (hemoglobin 5.7 g/dL). Gestational diabetes.  Patient has gestational diabetes on early screening and has started checking her blood glucose levels.  Fasting levels are below 99 and postprandial levels are below 140 mg/dL.  Ultrasound We performed fetal anatomy scan. No makers of aneuploidies or fetal structural defects are seen. Fetal anatomical survey could not be completed because of fetal position and early gestation. Fetal biometry is consistent with her previously-established dates. Amniotic fluid is normal and good fetal activity is seen. Patient understands the limitations of ultrasound in detecting fetal anomalies.  As maternal obesity imposes limitations on the resolution of images, fetal anomalies may be missed.  Our concerns include: Gestational diabetes I explained the diagnosis of gestational diabetes.  I emphasized the importance of good blood glucose control to prevent adverse fetal or neonatal outcomes.  I discussed normal range of blood glucose values. I encouraged her to check her blood glucose regularly. Possible complications of gestational diabetes include fetal macrosomia, shoulder dystocia and birth injuries, stillbirth (in poorly controlled diabetes) and neonatal respiratory  syndrome and other complications.  In about 85% of cases, gestational diabetes is well controlled by diet alone.  Exercise reduces the need for insulin.  Medical treatment includes oral hypoglycemics or insulin.  Timing of delivery: In well-controlled diabetes on diet, patient can be delivered at 74- or 40-weeks' gestation. Vaginal delivery can be safely attempted. Type 2 diabetes develops in up to 50% of women with GDM. I recommend postpartum screening with 75-g glucose load at 6 to 12 weeks after delivery. I discussed ultrasound protocol of serial fetal growth assessments.  Previous cesarean delivery I discussed the benefits and risks of VBAC.  The risk of uterine scar dehiscence is about 1%.  Cesarean deliveries increase the risks of hemorrhage, infection and venous thromboembolism.  Repeat cesarean deliveries increase the risks of placenta previa and/or placenta accreta spectrum. Long-term complications include small bowel obstruction.  Based on [facility] calculator, the likelihood of successful vaginal delivery is 37%. Patient is 4\' 11"  tall.  Patient is keen on VBAC.  History of fetal growth restriction and history of gestational hypertension I counseled the patient that the recurrence rate of fetal growth restriction is about 20% in the absence of other high risk factors.  Gestational hypertension might have been the contributing factor.  I discussed the benefit of low-dose aspirin  prophylaxis that the laser prevents preeclampsia.  Patient takes low-dose aspirin  prophylaxis.  Myomas I explained the ultrasound findings of small intramural myomas.  Myomas rarely cause complications but can lead to abdominal pain (degeneration) preterm delivery in some cases.  Myomas tend to increase in pregnancy and regressed after delivery.   Recommendations -An appointment was made for her to return in 4 weeks for completion of fetal anatomy. - Fetal growth assessments every 4  weeks till delivery. - Weekly antenatal testing from  [redacted] weeks gestation if patient takes metformin or insulin.   Consultation including face-to-face (more than 50%) counseling 45 minutes.

## 2024-03-16 ENCOUNTER — Other Ambulatory Visit: Payer: Self-pay

## 2024-03-16 DIAGNOSIS — O24410 Gestational diabetes mellitus in pregnancy, diet controlled: Secondary | ICD-10-CM

## 2024-03-22 ENCOUNTER — Ambulatory Visit: Admitting: Family Medicine

## 2024-03-22 ENCOUNTER — Other Ambulatory Visit: Payer: Self-pay

## 2024-03-22 VITALS — BP 123/84 | HR 109 | Wt 164.0 lb

## 2024-03-22 DIAGNOSIS — Z2839 Other underimmunization status: Secondary | ICD-10-CM | POA: Diagnosis not present

## 2024-03-22 DIAGNOSIS — O24410 Gestational diabetes mellitus in pregnancy, diet controlled: Secondary | ICD-10-CM | POA: Diagnosis not present

## 2024-03-22 DIAGNOSIS — O365931 Maternal care for other known or suspected poor fetal growth, third trimester, fetus 1: Secondary | ICD-10-CM

## 2024-03-22 DIAGNOSIS — O133 Gestational [pregnancy-induced] hypertension without significant proteinuria, third trimester: Secondary | ICD-10-CM

## 2024-03-22 DIAGNOSIS — O09893 Supervision of other high risk pregnancies, third trimester: Secondary | ICD-10-CM

## 2024-03-22 DIAGNOSIS — Z3A19 19 weeks gestation of pregnancy: Secondary | ICD-10-CM | POA: Diagnosis not present

## 2024-03-22 DIAGNOSIS — O34219 Maternal care for unspecified type scar from previous cesarean delivery: Secondary | ICD-10-CM | POA: Diagnosis not present

## 2024-03-22 DIAGNOSIS — O09899 Supervision of other high risk pregnancies, unspecified trimester: Secondary | ICD-10-CM

## 2024-03-22 DIAGNOSIS — Z3480 Encounter for supervision of other normal pregnancy, unspecified trimester: Secondary | ICD-10-CM

## 2024-03-23 NOTE — Progress Notes (Signed)
   PRENATAL VISIT NOTE  Subjective:  April Strong is a 28 y.o. G3P1011 at [redacted]w[redacted]d being seen today for ongoing prenatal care.  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  Patient reports no complaints.  Contractions: Not present. Vag. Bleeding: None.  Movement: Present. Denies leaking of fluid.   The following portions of the patient's history were reviewed and updated as appropriate: allergies, current medications, past family history, past medical history, past social history, past surgical history and problem list.   Objective:    Vitals:   03/22/24 1035  BP: 123/84  Pulse: (!) 109  Weight: 164 lb (74.4 kg)    Fetal Status:  Fetal Heart Rate (bpm): 166   Movement: Present    General: Alert, oriented and cooperative. Patient is in no acute distress.  Skin: Skin is warm and dry. No rash noted.   Cardiovascular: Normal heart rate noted  Respiratory: Normal respiratory effort, no problems with respiration noted  Abdomen: Soft, gravid, appropriate for gestational age.  Pain/Pressure: Present     Pelvic: Cervical exam deferred        Extremities: Normal range of motion.  Edema: None  Mental Status: Normal mood and affect. Normal behavior. Normal judgment and thought content.   Assessment and Plan:  Pregnancy: G3P1011 at [redacted]w[redacted]d 1. Supervision of other normal pregnancy, antepartum Continue prenatal care.  2. Diet controlled gestational diabetes mellitus (GDM) in second trimester See log, continue diet    3. Hx of Gestational hypertension  (Primary) On ASA  4. Rubella non-immune status, antepartum MMR pp  5. Hx of IUGR (intrauterine growth restriction) Normal growth recently 18%  6. Hx of cesarean section complicating pregnancy For TOLAC - consent at 28 weeks  7. [redacted] weeks gestation of pregnancy   General obstetric precautions including but not limited to vaginal bleeding, contractions, leaking of fluid and fetal movement were reviewed in detail with the patient. Please refer to After Visit Summary for other counseling recommendations.   Return in 4 weeks (on 04/19/2024).  Future Appointments  Date Time Provider Department Center  04/11/2024  9:15 AM [facility] PROVIDER 1 [facility]  04/11/2024  9:30 AM [facility] US5 [facility]  04/18/2024 11:15 AM Regino Camie DELENA EDDY [facility]  04/20/2024  3:15 PM [facility]    Glenys GORMAN Birk, MD

## 2024-03-29 ENCOUNTER — Encounter: Payer: Self-pay | Admitting: Family Medicine

## 2024-03-31 DIAGNOSIS — Z419 Encounter for procedure for purposes other than remedying health state, unspecified: Secondary | ICD-10-CM | POA: Diagnosis not present

## 2024-04-11 ENCOUNTER — Ambulatory Visit (HOSPITAL_BASED_OUTPATIENT_CLINIC_OR_DEPARTMENT_OTHER)

## 2024-04-11 ENCOUNTER — Ambulatory Visit: Attending: Obstetrics and Gynecology | Admitting: Obstetrics and Gynecology

## 2024-04-11 ENCOUNTER — Other Ambulatory Visit: Payer: Self-pay | Admitting: *Deleted

## 2024-04-11 VITALS — BP 129/65 | HR 101

## 2024-04-11 DIAGNOSIS — O99012 Anemia complicating pregnancy, second trimester: Secondary | ICD-10-CM | POA: Insufficient documentation

## 2024-04-11 DIAGNOSIS — Z3A22 22 weeks gestation of pregnancy: Secondary | ICD-10-CM | POA: Diagnosis not present

## 2024-04-11 DIAGNOSIS — O09899 Supervision of other high risk pregnancies, unspecified trimester: Secondary | ICD-10-CM

## 2024-04-11 DIAGNOSIS — Z2839 Other underimmunization status: Secondary | ICD-10-CM

## 2024-04-11 DIAGNOSIS — O3412 Maternal care for benign tumor of corpus uteri, second trimester: Secondary | ICD-10-CM | POA: Insufficient documentation

## 2024-04-11 DIAGNOSIS — O24410 Gestational diabetes mellitus in pregnancy, diet controlled: Secondary | ICD-10-CM | POA: Insufficient documentation

## 2024-04-11 DIAGNOSIS — O09292 Supervision of pregnancy with other poor reproductive or obstetric history, second trimester: Secondary | ICD-10-CM | POA: Insufficient documentation

## 2024-04-11 DIAGNOSIS — O34219 Maternal care for unspecified type scar from previous cesarean delivery: Secondary | ICD-10-CM

## 2024-04-11 DIAGNOSIS — O99212 Obesity complicating pregnancy, second trimester: Secondary | ICD-10-CM | POA: Insufficient documentation

## 2024-04-11 DIAGNOSIS — Z3480 Encounter for supervision of other normal pregnancy, unspecified trimester: Secondary | ICD-10-CM

## 2024-04-11 DIAGNOSIS — D649 Anemia, unspecified: Secondary | ICD-10-CM

## 2024-04-11 DIAGNOSIS — D259 Leiomyoma of uterus, unspecified: Secondary | ICD-10-CM

## 2024-04-11 DIAGNOSIS — O99810 Abnormal glucose complicating pregnancy: Secondary | ICD-10-CM

## 2024-04-11 DIAGNOSIS — O24419 Gestational diabetes mellitus in pregnancy, unspecified control: Secondary | ICD-10-CM

## 2024-04-11 NOTE — Progress Notes (Signed)
 After review, MFM consult with provider is not indicated for today  Arna Ranks, MD 04/11/2024 4:56 PM  [facility]

## 2024-04-17 NOTE — Progress Notes (Unsigned)
   PRENATAL VISIT NOTE  Subjective:  April Strong is a 28 y.o. G3P1011 at [redacted]w[redacted]d being seen today for ongoing prenatal care.  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  Patient reports {[id number]}.   .  .   . Denies leaking of fluid.   The following portions of the patient's history were reviewed and updated as appropriate: allergies, current medications, past family history, past medical history, past social history, past surgical history and problem list.   Objective:    There were no vitals filed for this visit.  Fetal Status:           General: Alert, oriented and cooperative. Patient is in no acute distress.  Skin: Skin is warm and dry. No rash noted.   Cardiovascular: Normal heart rate noted  Respiratory: Normal respiratory effort, no problems with respiration noted  Abdomen: Soft, gravid, appropriate for gestational age.        Pelvic: Cervical exam deferred        Extremities: Normal range of motion.     Mental Status: Normal mood and affect. Normal behavior. Normal judgment and thought content.   Assessment and Plan:  Pregnancy: G3P1011 at [redacted]w[redacted]d 1. Supervision of other normal pregnancy, antepartum (Primary) - Doing well, feeling regular and vigorous fetal movement   2. [redacted] weeks gestation of pregnancy - Routine PNC, anticipatory guidance.    3. Diet controlled gestational diabetes mellitus (GDM) in second trimester - Log reviewed. Continue diet.   4. Hx of Gestational hypertension - On 81mg  ASA daily.   5. Hx of cesarean section  complicating pregnancy - Desires TOLAC, plan consent at 28 weeks.   6. Hx of IUGR (intrauterine growth restriction) - Followed by MFM, fetus 15%ile at last scan (04/11/24). Has rescan 4 week from previous.  Preterm labor symptoms and general obstetric precautions including but not limited to vaginal bleeding, contractions, leaking of fluid and fetal movement were reviewed in detail with the patient. Please refer to After Visit Summary for other counseling recommendations.   No follow-ups on file.  Future Appointments  Date Time Provider Department Center  04/18/2024 11:15 AM Strong April DELENA EDDY [facility]  05/11/2024 11:15 AM [facility] PROVIDER 1 [facility]  05/11/2024 11:30 AM [facility] US1 [facility]    April Strong, CNM

## 2024-04-18 ENCOUNTER — Ambulatory Visit (INDEPENDENT_AMBULATORY_CARE_PROVIDER_SITE_OTHER): Admitting: Certified Nurse Midwife

## 2024-04-18 ENCOUNTER — Other Ambulatory Visit: Payer: Self-pay

## 2024-04-18 VITALS — BP 134/76 | HR 105 | Wt 165.0 lb

## 2024-04-18 DIAGNOSIS — O365931 Maternal care for other known or suspected poor fetal growth, third trimester, fetus 1: Secondary | ICD-10-CM

## 2024-04-18 DIAGNOSIS — O24410 Gestational diabetes mellitus in pregnancy, diet controlled: Secondary | ICD-10-CM | POA: Diagnosis not present

## 2024-04-18 DIAGNOSIS — Z3A23 23 weeks gestation of pregnancy: Secondary | ICD-10-CM

## 2024-04-18 DIAGNOSIS — O365921 Maternal care for other known or suspected poor fetal growth, second trimester, fetus 1: Secondary | ICD-10-CM | POA: Diagnosis not present

## 2024-04-18 DIAGNOSIS — O132 Gestational [pregnancy-induced] hypertension without significant proteinuria, second trimester: Secondary | ICD-10-CM

## 2024-04-18 DIAGNOSIS — O133 Gestational [pregnancy-induced] hypertension without significant proteinuria, third trimester: Secondary | ICD-10-CM

## 2024-04-18 DIAGNOSIS — O34219 Maternal care for unspecified type scar from previous cesarean delivery: Secondary | ICD-10-CM | POA: Diagnosis not present

## 2024-04-18 DIAGNOSIS — Z3480 Encounter for supervision of other normal pregnancy, unspecified trimester: Secondary | ICD-10-CM

## 2024-04-19 ENCOUNTER — Encounter: Attending: Family Medicine | Admitting: Dietician

## 2024-04-19 DIAGNOSIS — O24410 Gestational diabetes mellitus in pregnancy, diet controlled: Secondary | ICD-10-CM | POA: Insufficient documentation

## 2024-04-19 NOTE — Progress Notes (Signed)
 Patient was seen on 04/19/2024 for Gestational Diabetes self-management class at the [facility]. The following learning objectives were met by the patient during this course:  States the definition of Gestational Diabetes States why dietary management is important in controlling blood glucose Describes the effects each nutrient has on blood glucose levels Demonstrates ability to create a balanced meal plan Demonstrates carbohydrate counting  States when to check blood glucose levels Demonstrates proper blood glucose monitoring techniques States the effect of stress and exercise on blood glucose levels States the importance of limiting caffeine and abstaining from alcohol and smoking   Patient has a meter prior to visit. Patient is instructed to begin testing pre breakfast and 2 hours after each meal. Blood glucose today in class 136 mg/dL, reported as 2 hour post prandial per Pt  Patient instructed to monitor glucose levels:  QID FBS: 60 - <95 2 hour: <120  *Patient received handouts: Nutrition Diabetes and Pregnancy Carbohydrate Counting List Blood glucose log Snack ideas for diabetes during pregnancy Plate Planner  Patient will be seen for follow-up as needed.

## 2024-04-20 ENCOUNTER — Other Ambulatory Visit: Payer: Self-pay

## 2024-05-02 ENCOUNTER — Ambulatory Visit (HOSPITAL_COMMUNITY): Admission: EM | Admit: 2024-05-02 | Discharge: 2024-05-02 | Disposition: A | Discharge status: Home / Self Care

## 2024-05-02 ENCOUNTER — Encounter (HOSPITAL_COMMUNITY): Payer: Self-pay

## 2024-05-02 DIAGNOSIS — J069 Acute upper respiratory infection, unspecified: Secondary | ICD-10-CM | POA: Diagnosis not present

## 2024-05-02 NOTE — ED Provider Notes (Signed)
 [facility]    CSN: [id number] Arrival date & time: 05/02/24  1025      History   Chief Complaint Chief Complaint  Patient presents with   Cough    HPI April Strong is a 28 y.o. female.   Patient that is 6 months pregnant, presents today due to throat pain and nasal congestion since Sunday.  Patient states that she drank orange juice and tea for symptoms with no significant relief.  Patient denies fever, headache, or productive cough.  Patient states her son had similar symptoms on Thursday but has since improved.  Patient states that she has not taken anything over-the-counter for her symptoms because she is unsure what to take.   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 01/27/2024   Rubella non-immune status, antepartum 01/27/2024   Hx of cesarean section complicating pregnancy 01/25/2024   Supervision of other normal pregnancy, antepartum 01/18/2024   Hx of Gestational hypertension 05/25/2019   Hx of IUGR (intrauterine growth restriction) 05/23/2019    Past Surgical History:  Procedure Laterality Date   CESAREAN SECTION N/A 05/25/2019   Procedure: CESAREAN SECTION;  Surgeon: Alger Gong, MD;  Location: [facility];  Service: Obstetrics;  Laterality: N/A;   LEFT OOPHORECTOMY     benign cyst   SALPINGECTOMY Left     OB History     Gravida  3   Para  1   Term  1   Preterm  0   AB  1   Living  1      SAB  1   IAB  0   Ectopic  0   Multiple  0   Live Births  1            Home Medications    Prior to Admission medications   Medication Sig Start Date End Date Taking? Authorizing Provider   Accu-Chek Softclix Lancets lancets Use as instructed 03/06/24  Yes Fredirick Glenys RAMAN, MD  aspirin  EC 81 MG tablet Take 1 tablet (81 mg total) by mouth daily. 02/22/24  Yes Fredirick Glenys RAMAN, MD  glucose blood (ACCU-CHEK GUIDE TEST) test strip Use as instructed 03/06/24  Yes Fredirick Glenys RAMAN, MD  Prenatal Vit-Fe Fumarate-FA (PRENATAL VITAMIN) 27-0.8 MG TABS Take 1 tablet by mouth daily. 12/17/23  Yes Cresenzo, Norleen GAILS, MD    Family History History reviewed. No pertinent family history.  Social History Social History   Tobacco Use   Smoking status: Never   Smokeless tobacco: Never  Vaping Use   Vaping status: Never Used  Substance Use Topics   Alcohol use: No   Drug use: No     Allergies   Patient has no known allergies.   Review of Systems Review of Systems  Respiratory:  Positive for cough.      Physical Exam Triage Vital Signs ED Triage Vitals  Encounter Vitals Group     BP 05/02/24 1123 119/82     Girls Systolic BP Percentile --      Girls Diastolic BP Percentile --      Boys Systolic BP Percentile --      Boys Diastolic BP  Percentile --      Pulse Rate 05/02/24 1123 (!) 108     Resp 05/02/24 1123 18     Temp 05/02/24 1123 98.1 F (36.7 C)     Temp Source 05/02/24 1123 Oral     SpO2 05/02/24 1123 98 %     Weight 05/02/24 1122 163 lb (73.9 kg)     Height 05/02/24 1122 4' 11 (1.499 m)     Head Circumference --      Peak Flow --      Pain Score 05/02/24 1122 8     Pain Loc --      Pain Education --      Exclude from Growth Chart --    No data found.  Updated Vital Signs BP 119/82 (BP Location: Left Arm)   Pulse (!) 108   Temp 98.1 F (36.7 C) (Oral)   Resp 18   Ht 4' 11 (1.499 m)   Wt 163 lb (73.9 kg)   LMP 11/12/2023 (Exact Date)   SpO2 98%   BMI 32.92 kg/m   Visual Acuity Right Eye Distance:   Left Eye Distance:   Bilateral Distance:    Right Eye Near:   Left Eye Near:    Bilateral Near:     Physical Exam Vitals and nursing note reviewed.   Constitutional:      General: She is not in acute distress.    Appearance: Normal appearance. She is not ill-appearing, toxic-appearing or diaphoretic.  Eyes:     General: No scleral icterus. Cardiovascular:     Rate and Rhythm: Normal rate and regular rhythm.     Heart sounds: Normal heart sounds.  Pulmonary:     Effort: Pulmonary effort is normal. No respiratory distress.     Breath sounds: Normal breath sounds. No wheezing or rhonchi.  Skin:    General: Skin is warm.  Neurological:     Mental Status: She is alert and oriented to person, place, and time.  Psychiatric:        Mood and Affect: Mood normal.        Behavior: Behavior normal.      UC Treatments / Results  Labs (all labs ordered are listed, but only abnormal results are displayed) Labs Reviewed - No data to display  EKG   Radiology No results found.  Procedures Procedures (including critical care time)  Medications Ordered in UC Medications - No data to display  Initial Impression / Assessment and Plan / UC Course  I have reviewed the triage vital signs and the nursing notes.  Pertinent labs & imaging results that were available during my care of the patient were reviewed by me and considered in my medical decision making (see chart for details).     Viral URI-vital signs within normal limits, no acute distress noted during exam.  Patient provided a handout with medications safe to take during pregnancy during viral illness. Final Clinical Impressions(s) / UC Diagnoses   Final diagnoses:  Viral URI     Discharge Instructions      See list provided of OTC meds that are safe to take in pregnancy.     ED Prescriptions   None    PDMP not reviewed this encounter.   Andra Corean BROCKS, PA-C 05/02/24 1221

## 2024-05-02 NOTE — ED Triage Notes (Signed)
 Cough, chest pain, runny nose, sore throat, and SOB 2 days. Patient is currently pregnant. Patient's son was sick first and being seen today.   Has not taken anything otc for her symptoms.

## 2024-05-02 NOTE — Discharge Instructions (Addendum)
 See list provided of OTC meds that are safe to take in pregnancy.

## 2024-05-08 ENCOUNTER — Telehealth: Payer: Self-pay | Admitting: Family Medicine

## 2024-05-08 DIAGNOSIS — R0981 Nasal congestion: Secondary | ICD-10-CM | POA: Diagnosis not present

## 2024-05-08 DIAGNOSIS — R059 Cough, unspecified: Secondary | ICD-10-CM | POA: Diagnosis not present

## 2024-05-08 DIAGNOSIS — J209 Acute bronchitis, unspecified: Secondary | ICD-10-CM | POA: Diagnosis not present

## 2024-05-08 NOTE — Telephone Encounter (Signed)
 Good morning,   Ms. April Strong called today to inform us  that she is having some chest tightening, wheezing, coughing, and stuffy nose. She would like to know if she needs to go to an urgent care. I informed her that we would communicate her symptoms to clinical staff and follow up with her.   Regards, Elex

## 2024-05-09 NOTE — Telephone Encounter (Signed)
 Pt seen by Urgent Care for Concerns.

## 2024-05-11 ENCOUNTER — Ambulatory Visit

## 2024-05-11 ENCOUNTER — Ambulatory Visit: Attending: Obstetrics and Gynecology | Admitting: Maternal & Fetal Medicine

## 2024-05-11 ENCOUNTER — Other Ambulatory Visit: Payer: Self-pay | Admitting: *Deleted

## 2024-05-11 VITALS — BP 131/78 | HR 101

## 2024-05-11 DIAGNOSIS — O3412 Maternal care for benign tumor of corpus uteri, second trimester: Secondary | ICD-10-CM

## 2024-05-11 DIAGNOSIS — D259 Leiomyoma of uterus, unspecified: Secondary | ICD-10-CM | POA: Diagnosis not present

## 2024-05-11 DIAGNOSIS — O99212 Obesity complicating pregnancy, second trimester: Secondary | ICD-10-CM

## 2024-05-11 DIAGNOSIS — O09292 Supervision of pregnancy with other poor reproductive or obstetric history, second trimester: Secondary | ICD-10-CM | POA: Diagnosis not present

## 2024-05-11 DIAGNOSIS — Z3A27 27 weeks gestation of pregnancy: Secondary | ICD-10-CM | POA: Diagnosis not present

## 2024-05-11 DIAGNOSIS — O34219 Maternal care for unspecified type scar from previous cesarean delivery: Secondary | ICD-10-CM | POA: Diagnosis not present

## 2024-05-11 DIAGNOSIS — O24410 Gestational diabetes mellitus in pregnancy, diet controlled: Secondary | ICD-10-CM

## 2024-05-11 DIAGNOSIS — O99012 Anemia complicating pregnancy, second trimester: Secondary | ICD-10-CM | POA: Diagnosis not present

## 2024-05-11 DIAGNOSIS — Z2839 Other underimmunization status: Secondary | ICD-10-CM

## 2024-05-11 DIAGNOSIS — Z3480 Encounter for supervision of other normal pregnancy, unspecified trimester: Secondary | ICD-10-CM

## 2024-05-11 DIAGNOSIS — O365931 Maternal care for other known or suspected poor fetal growth, third trimester, fetus 1: Secondary | ICD-10-CM

## 2024-05-11 DIAGNOSIS — O99810 Abnormal glucose complicating pregnancy: Secondary | ICD-10-CM

## 2024-05-11 DIAGNOSIS — E669 Obesity, unspecified: Secondary | ICD-10-CM | POA: Diagnosis not present

## 2024-05-11 NOTE — Progress Notes (Signed)
   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 G3P1011 at [redacted]w[redacted]d here for ultrasound and consultation. April Strong is doing well today with no acute concerns. Today we focused on the following:   GDMA1: The patient reports good blood sugar control.  She is using her diet and exercise alone for glycemic control.  I discussed that the estimated fetal weight is normal today.  She has a history of FGR and will have serial growth ultrasounds throughout the pregnancy.  The patient had time to ask questions that were answered to her satisfaction.  She verbalized understanding and agrees to proceed with the plan below.  Sonographic findings Single intrauterine pregnancy at 27w 0d.  Fetal cardiac activity:  Observed and appears normal. Presentation: Transverse, head to maternal left. Interval fetal anatomy appears normal. Fetal biometry shows the estimated fetal weight at the 45 percentile. Amniotic fluid volume: Within normal limits. MVP: 4.38 cm. Placenta: Fundal.  There are limitations of prenatal ultrasound such as the inability to detect certain abnormalities due to poor visualization. Various factors such as fetal position, gestational age and maternal body  habitus may increase the difficulty in visualizing the fetal anatomy.    Recommendations -Serial growth ultrasounds every 4-6 weeks until delivery -Delivery around EDD or sooner if indicated  Review of Systems: A review of systems was performed and was negative except per HPI   Vitals and Physical Exam    05/11/2024   11:25 AM 05/02/2024   11:23 AM 05/02/2024   11:22 AM  Vitals with BMI  Height   4' 11  Weight   163 lbs  BMI   32.9  Systolic 131 119   Diastolic 78 82   Pulse 101 108     Sitting comfortably on the sonogram table Nonlabored breathing Normal rate and rhythm Abdomen is nontender  Past pregnancies OB History  Gravida Para Term Preterm AB Living  3 1 1  0 1 1  SAB IAB Ectopic Multiple Live Births  1 0 0 0 1    # Outcome Date GA Lbr Len/2nd Weight Sex Type Anes PTL Lv  3 Current           2 SAB 03/2022          1 Term 05/25/19 [redacted]w[redacted]d  5 lb 12.2 oz (2.615 kg) M CS-LTranv EPI  LIV     I spent 20 minutes reviewing the patients chart, including labs and images as well as counseling the patient about her medical conditions. Greater than 50% of the time was spent in direct face-to-face patient counseling.  April Strong  MFM, [facility]   05/11/2024  12:26 PM

## 2024-05-16 ENCOUNTER — Ambulatory Visit (INDEPENDENT_AMBULATORY_CARE_PROVIDER_SITE_OTHER): Admitting: Family Medicine

## 2024-05-16 ENCOUNTER — Other Ambulatory Visit: Payer: Self-pay

## 2024-05-16 VITALS — BP 123/84 | HR 103 | Wt 166.0 lb

## 2024-05-16 DIAGNOSIS — O24410 Gestational diabetes mellitus in pregnancy, diet controlled: Secondary | ICD-10-CM

## 2024-05-16 DIAGNOSIS — O34219 Maternal care for unspecified type scar from previous cesarean delivery: Secondary | ICD-10-CM | POA: Diagnosis not present

## 2024-05-16 DIAGNOSIS — O133 Gestational [pregnancy-induced] hypertension without significant proteinuria, third trimester: Secondary | ICD-10-CM

## 2024-05-16 DIAGNOSIS — Z3A27 27 weeks gestation of pregnancy: Secondary | ICD-10-CM

## 2024-05-16 DIAGNOSIS — Z2839 Other underimmunization status: Secondary | ICD-10-CM

## 2024-05-16 DIAGNOSIS — Z3480 Encounter for supervision of other normal pregnancy, unspecified trimester: Secondary | ICD-10-CM | POA: Diagnosis not present

## 2024-05-16 DIAGNOSIS — O132 Gestational [pregnancy-induced] hypertension without significant proteinuria, second trimester: Secondary | ICD-10-CM

## 2024-05-16 NOTE — Progress Notes (Signed)
   PRENATAL VISIT NOTE  Subjective:  April Strong is a 28 y.o. G3P1011 at [redacted]w[redacted]d being seen today for ongoing prenatal care.  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  Patient reports no complaints.  Contractions: Not present. Vag. Bleeding: None.  Movement: Present. Denies leaking of fluid.   The following portions of the patient's history were reviewed and updated as appropriate: allergies, current medications, past family history, past medical history, past social history, past surgical history and problem list.   Objective:    Vitals:   05/16/24 0831 05/16/24 0851  BP: (!) 141/83 123/84  Pulse: (!) 118 (!) 103  Weight: 166 lb (75.3 kg)     Fetal Status:  Fetal Heart Rate (bpm): 153 Fundal Height: 27 cm Movement: Present    General: Alert, oriented and cooperative. Patient is in no acute distress.  Skin: Skin is warm and dry. No rash noted.   Cardiovascular: Normal heart rate noted  Respiratory: Normal respiratory effort, no problems with respiration noted  Abdomen: Soft, gravid, appropriate for gestational age.  Pain/Pressure: Absent     Pelvic: Cervical exam deferred        Extremities: Normal range of motion.  Edema: None  Mental Status: Normal mood and affect. Normal behavior. Normal judgment and thought content.   Assessment and Plan:  Pregnancy: G3P1011 at [redacted]w[redacted]d 1. [redacted] weeks gestation of pregnancy   2. Supervision of other normal pregnancy, antepartum (Primary) 28 week labs today - HIV Antibody  (routine testing w rflx) - RPR - CBC  3. Hx of Gestational hypertension On ASA  4. Diet controlled gestational diabetes mellitus (GDM) in second trimester No log, reports CBGs in normal range Last EFW 45%  5. Rubella non-immune status, antepartum MMR pp  6. Hx of cesarean section complicating pregnancy Reviewed risks and benefits on the TOLAC consent form. We discussed risk of uterine rupture about 1% with TOL and 1/10 with significant injury to fetus. All her questions answered and she signed a consent indicating a preference for TOLAC. A copy of the consent was given to the patient.  Preterm labor symptoms and general obstetric precautions including but not limited to vaginal bleeding, contractions, leaking of fluid and fetal movement were reviewed in detail with the patient. Please refer to After Visit Summary for other counseling recommendations.   Return in 2 weeks (on 05/30/2024).  Future Appointments  Date Time Provider Department Center  05/30/2024  8:55 AM Nicholaus Burnard HERO, MD [facility]  06/19/2024 10:55 AM Herchel Gloris LABOR, MD [facility]  06/22/2024  9:15 AM [facility] PROVIDER 1 [facility]  06/22/2024  9:30 AM [facility] US5 [facility]  07/03/2024 11:15 AM Cleatus Moccasin, MD [facility]  07/18/2024  8:15 AM Jomarie Charlie LABOR, MD [facility]  07/18/2024  9:15 AM [facility] PROVIDER 1 [facility]  07/18/2024  9:30 AM [facility] US1 [facility]  07/25/2024 11:15 AM [facility]-GENERAL 1 [facility]  07/31/2024 11:15 AM [facility]GENERAL 1 [facility]  08/07/2024 11:15 AM [facility]GENERAL 1 [facility]  08/14/2024 11:15 AM [facility]GENERAL 1 [facility]    Glenys GORMAN Birk, MD

## 2024-05-17 ENCOUNTER — Ambulatory Visit: Payer: Self-pay | Admitting: Family Medicine

## 2024-05-17 LAB — CBC
Hematocrit: 27.9 % — ABNORMAL LOW (ref 34.0–46.6)
Hemoglobin: 8.6 g/dL — ABNORMAL LOW (ref 11.1–15.9)
MCH: 25.3 pg — ABNORMAL LOW (ref 26.6–33.0)
MCHC: 30.8 g/dL — ABNORMAL LOW (ref 31.5–35.7)
MCV: 82 fL (ref 79–97)
Platelets: 357 x10E3/uL (ref 150–450)
RBC: 3.4 x10E6/uL — ABNORMAL LOW (ref 3.77–5.28)
RDW: 14.5 % (ref 11.7–15.4)
WBC: 8.5 x10E3/uL (ref 3.4–10.8)

## 2024-05-17 LAB — RPR: RPR Ser Ql: NONREACTIVE

## 2024-05-17 LAB — HIV ANTIBODY (ROUTINE TESTING W REFLEX): HIV Screen 4th Generation wRfx: NONREACTIVE

## 2024-05-18 ENCOUNTER — Other Ambulatory Visit: Payer: Self-pay | Admitting: Family Medicine

## 2024-05-18 ENCOUNTER — Telehealth: Payer: Self-pay

## 2024-05-18 NOTE — Telephone Encounter (Signed)
 Dr. Fredirick, patient will be scheduled as soon as possible.  Auth Submission: NO AUTH NEEDED Site of care: Site of care: [facility] Payer: [employer] medicaid Medication & CPT/J Code(s) submitted: Venofer (Iron Sucrose) J1756 Diagnosis Code:  Route of submission (phone, fax, portal):  Phone # Fax # Auth type: Buy/Bill PB Units/visits requested: 200mg  x 5 doses Reference number:  Approval from: 05/18/24 to 07/19/24

## 2024-05-23 ENCOUNTER — Ambulatory Visit

## 2024-05-23 ENCOUNTER — Encounter: Admitting: Obstetrics and Gynecology

## 2024-05-23 VITALS — BP 132/72 | HR 84 | Temp 97.9°F | Resp 18 | Ht 59.0 in | Wt 168.0 lb

## 2024-05-23 DIAGNOSIS — Z3A28 28 weeks gestation of pregnancy: Secondary | ICD-10-CM | POA: Diagnosis not present

## 2024-05-23 DIAGNOSIS — O99013 Anemia complicating pregnancy, third trimester: Secondary | ICD-10-CM | POA: Diagnosis not present

## 2024-05-23 MED ORDER — IRON SUCROSE 20 MG/ML IV SOLN
200.0000 mg | Freq: Once | INTRAVENOUS | Status: AC
  Administered 2024-05-23: 200 mg via INTRAVENOUS
  Filled 2024-05-23: qty 10

## 2024-05-23 NOTE — Patient Instructions (Signed)

## 2024-05-23 NOTE — Progress Notes (Signed)
 Diagnosis: Iron Deficiency Anemia  Provider:  Praveen Mannam MD  Procedure: IV Push  IV Type: Peripheral, IV Location: R Forearm  Venofer (Iron Sucrose), Dose: 200 mg  Post Infusion IV Care: Observation period completed and Peripheral IV Discontinued  Discharge: Condition: Stable, Destination: Home . AVS Provided  Performed by:  Ashby Beery, RN    At the end of the observation, Pt stated  tummy pain. Vital stable and no other symptoms mentioned.Ordering provider notified about the pt symptoms. Relay the provider response to pt  if it gets worse, she can come to [facility] if needed. I would advise going home and resting and see if it improves . Pt agreed to go home now and will contact provider if needed .

## 2024-05-25 ENCOUNTER — Ambulatory Visit (INDEPENDENT_AMBULATORY_CARE_PROVIDER_SITE_OTHER)

## 2024-05-25 VITALS — BP 114/58 | HR 101 | Temp 98.1°F | Resp 16 | Ht 59.0 in | Wt 167.8 lb

## 2024-05-25 DIAGNOSIS — O99013 Anemia complicating pregnancy, third trimester: Secondary | ICD-10-CM | POA: Diagnosis not present

## 2024-05-25 DIAGNOSIS — Z3A29 29 weeks gestation of pregnancy: Secondary | ICD-10-CM

## 2024-05-25 MED ORDER — IRON SUCROSE 20 MG/ML IV SOLN
200.0000 mg | Freq: Once | INTRAVENOUS | Status: AC
  Administered 2024-05-25: 200 mg via INTRAVENOUS
  Filled 2024-05-25: qty 10

## 2024-05-25 NOTE — Progress Notes (Signed)
 Diagnosis: Iron  Deficiency Anemia  Provider:  Praveen Mannam MD  Procedure: IV Push  IV Type: Peripheral, IV Location: L Antecubital  Venofer  (Iron  Sucrose), Dose: 200 mg  Post Infusion IV Care: Observation period completed and Peripheral IV Discontinued  Discharge: Condition: Good, Destination: Home . AVS Declined  Performed by:  Maximiano JONELLE Pouch, LPN

## 2024-05-29 ENCOUNTER — Ambulatory Visit

## 2024-05-29 VITALS — BP 129/80 | HR 98 | Temp 98.5°F | Resp 20 | Ht 59.0 in | Wt 167.8 lb

## 2024-05-29 DIAGNOSIS — O99013 Anemia complicating pregnancy, third trimester: Secondary | ICD-10-CM

## 2024-05-29 DIAGNOSIS — Z3A29 29 weeks gestation of pregnancy: Secondary | ICD-10-CM | POA: Diagnosis not present

## 2024-05-29 MED ORDER — IRON SUCROSE 20 MG/ML IV SOLN
200.0000 mg | Freq: Once | INTRAVENOUS | Status: AC
  Administered 2024-05-29: 200 mg via INTRAVENOUS
  Filled 2024-05-29: qty 10

## 2024-05-29 NOTE — Progress Notes (Signed)
 Diagnosis: Iron  Deficiency Anemia  Provider:  Praveen Mannam MD  Procedure: IV Push  IV Type: Peripheral, IV Location: L Antecubital  Venofer  (Iron  Sucrose), Dose: 200 mg  Post Infusion IV Care: Observation period completed and Peripheral IV Discontinued  Discharge: Condition: Good, Destination: Home . AVS Declined  Performed by:  Maximiano JONELLE Pouch, LPN

## 2024-05-30 ENCOUNTER — Other Ambulatory Visit (HOSPITAL_COMMUNITY)
Admission: RE | Admit: 2024-05-30 | Discharge: 2024-05-30 | Disposition: A | Discharge status: Home / Self Care | Source: Ambulatory Visit | Attending: Obstetrics and Gynecology | Admitting: Obstetrics and Gynecology

## 2024-05-30 ENCOUNTER — Ambulatory Visit (INDEPENDENT_AMBULATORY_CARE_PROVIDER_SITE_OTHER): Admitting: Obstetrics and Gynecology

## 2024-05-30 ENCOUNTER — Encounter: Payer: Self-pay | Admitting: Obstetrics and Gynecology

## 2024-05-30 VITALS — BP 124/57 | HR 97 | Wt 166.2 lb

## 2024-05-30 DIAGNOSIS — Z2839 Other underimmunization status: Secondary | ICD-10-CM

## 2024-05-30 DIAGNOSIS — O34219 Maternal care for unspecified type scar from previous cesarean delivery: Secondary | ICD-10-CM

## 2024-05-30 DIAGNOSIS — O24410 Gestational diabetes mellitus in pregnancy, diet controlled: Secondary | ICD-10-CM

## 2024-05-30 DIAGNOSIS — N898 Other specified noninflammatory disorders of vagina: Secondary | ICD-10-CM | POA: Insufficient documentation

## 2024-05-30 DIAGNOSIS — O365931 Maternal care for other known or suspected poor fetal growth, third trimester, fetus 1: Secondary | ICD-10-CM | POA: Diagnosis not present

## 2024-05-30 DIAGNOSIS — Z3490 Encounter for supervision of normal pregnancy, unspecified, unspecified trimester: Secondary | ICD-10-CM

## 2024-05-30 DIAGNOSIS — Z3480 Encounter for supervision of other normal pregnancy, unspecified trimester: Secondary | ICD-10-CM

## 2024-05-30 DIAGNOSIS — O99013 Anemia complicating pregnancy, third trimester: Secondary | ICD-10-CM

## 2024-05-30 DIAGNOSIS — Z3A29 29 weeks gestation of pregnancy: Secondary | ICD-10-CM

## 2024-05-30 DIAGNOSIS — O133 Gestational [pregnancy-induced] hypertension without significant proteinuria, third trimester: Secondary | ICD-10-CM

## 2024-05-30 NOTE — Progress Notes (Signed)
 PRENATAL VISIT NOTE  Subjective:  April Strong is a 28 y.o. G3P1011 at [redacted]w[redacted]d being seen today for ongoing prenatal care.  She is currently monitored for the following issues for this high-risk pregnancy and has Hx of IUGR (intrauterine growth restriction); Hx of Gestational hypertension; Supervision of other normal pregnancy, antepartum; Hx of cesarean section complicating pregnancy; Anemia affecting pregnancy; Prediabetes in mother during pregnancy; Rubella non-immune status, antepartum; Cervical high risk HPV (human papillomavirus) test positive; and Gestational diabetes mellitus in pregnancy, diet controlled on their problem list.  Patient reports reports some bleeding from vagina after pooping. Backache for several days.  Contractions: Irritability. Vag. Bleeding: Scant.  Movement: Present. Denies leaking of fluid.   The following portions of the patient's history were reviewed and updated as appropriate: allergies, current medications, past family history, past medical history, past social history, past surgical history and problem list.   Objective:   Vitals:   05/30/24 0953  BP: (!) 124/57  Pulse: 97  Weight: 166 lb 3.2 oz (75.4 kg)    Fetal Status:  Fetal Heart Rate (bpm): 159   Movement: Present    General: Alert, oriented and cooperative. Patient is in no acute distress.  Skin: Skin is warm and dry. No rash noted.   Cardiovascular: Normal heart rate noted  Respiratory: Normal respiratory effort, no problems with respiration noted  Abdomen: Soft, gravid, appropriate for gestational age.  Pain/Pressure: Present     Pelvic: SSE: thick white discharge, yellow tinged, no bleeding noted        Extremities: Normal range of motion.  Edema: None  Mental Status: Normal mood and affect. Normal behavior. Normal judgment and thought content.      04/19/2024   11:14 AM 01/25/2024    9:33 AM  Depression screen PHQ 2/9  Decreased Interest 0 1  Down, Depressed, Hopeless 0 1  PHQ  - 2 Score 0 2  Altered sleeping  1  Tired, decreased energy  2  Change in appetite  0  Feeling bad or failure about yourself   0  Trouble concentrating  0  Moving slowly or fidgety/restless  0  Suicidal thoughts  0  PHQ-9 Score  5      Data saved with a previous flowsheet row definition        01/25/2024    9:33 AM  GAD 7 : Generalized Anxiety Score  Nervous, Anxious, on Edge 0  Control/stop worrying 0  Worry too much - different things 0  Trouble relaxing 0  Restless 0  Easily annoyed or irritable 0  Afraid - awful might happen 0  Total GAD 7 Score 0    Assessment and Plan:  Pregnancy: G3P1011 at [redacted]w[redacted]d  1. Hx of Gestational hypertension (Primary)  2. Diet controlled gestational diabetes mellitus (GDM) in second trimester Did not bring log Fastings < 90 Postprandial <120  3. Hx of IUGR (intrauterine growth restriction) Last growth 45%tile  4. Supervision of other normal pregnancy, antepartum  5. Hx of cesarean section complicating pregnancy Reviewed risks/benefits of TOLAC versus RCS in detail. Patient counseled regarding potential vaginal delivery, chance of success, future implications, possible uterine rupture and need for urgent/emergent repeat cesarean. Counseled regarding potential need for repeat c-section for reasons unrelated to first c-section. Counseled regarding scheduled repeat cesarean including risks of bleeding, infection, damage to surrounding tissue, abnormal placentation, implications for future pregnancies. All questions answered.  Patient desires TOLAC, consent signed 05/30/2024.  6. Anemia affecting pregnancy in third trimester Has gotten  3 IV iron transfusions, has 2 more scheduled  7. Rubella non-immune status, antepartum MMR pp  8. [redacted] weeks gestation of pregnancy   Preterm labor symptoms and general obstetric precautions including but not limited to vaginal bleeding, contractions, leaking of fluid and fetal movement were reviewed in detail  with the patient. Please refer to After Visit Summary for other counseling recommendations.   Return in about 2 weeks (around 06/13/2024) for high OB.  Future Appointments  Date Time Provider Department Center  05/31/2024  8:15 AM [facility]IR 4 [facility] None  06/02/2024  8:15 AM [facility]CHAIR 2 [facility] None  06/19/2024 10:55 AM Anyanwu, Gloris LABOR, MD [facility]  06/22/2024  9:15 AM [facility] PROVIDER 1 [facility]  06/22/2024  9:30 AM [facility] US5 [facility]  07/03/2024 11:15 AM Cleatus Moccasin, MD [facility]  07/18/2024  8:15 AM Jomarie Charlie LABOR, MD [facility]  07/18/2024  9:15 AM [facility] PROVIDER 1 [facility]  07/18/2024  9:30 AM [facility] US1 [facility]  07/25/2024  9:55 AM Izell Harari, MD [facility]  07/31/2024 11:15 AM Herchel, Gloris LABOR, MD [facility]  08/07/2024 11:15 AM Fredirick Glenys RAMAN, MD [facility]  08/14/2024 11:15 AM Cleatus Moccasin, MD [facility]    Burnard CHRISTELLA Moats, MD

## 2024-05-30 NOTE — Patient Instructions (Signed)
 Take one capful of Miralax daily until stools are soft. You may increase/decrease as needed to have soft daily stools until you are not straining.

## 2024-05-31 ENCOUNTER — Ambulatory Visit (INDEPENDENT_AMBULATORY_CARE_PROVIDER_SITE_OTHER)

## 2024-05-31 VITALS — BP 125/80 | HR 96 | Temp 98.4°F | Resp 14 | Ht 59.0 in | Wt 166.4 lb

## 2024-05-31 DIAGNOSIS — Z3A29 29 weeks gestation of pregnancy: Secondary | ICD-10-CM | POA: Diagnosis not present

## 2024-05-31 DIAGNOSIS — O99013 Anemia complicating pregnancy, third trimester: Secondary | ICD-10-CM | POA: Diagnosis not present

## 2024-05-31 DIAGNOSIS — Z419 Encounter for procedure for purposes other than remedying health state, unspecified: Secondary | ICD-10-CM | POA: Diagnosis not present

## 2024-05-31 LAB — CERVICOVAGINAL ANCILLARY ONLY
Bacterial Vaginitis (gardnerella): NEGATIVE
Candida Glabrata: NEGATIVE
Candida Vaginitis: NEGATIVE
Chlamydia: NEGATIVE
Comment: NEGATIVE
Comment: NEGATIVE
Comment: NEGATIVE
Comment: NEGATIVE
Comment: NEGATIVE
Comment: NORMAL
Neisseria Gonorrhea: NEGATIVE
Trichomonas: NEGATIVE

## 2024-05-31 MED ORDER — IRON SUCROSE 20 MG/ML IV SOLN
200.0000 mg | Freq: Once | INTRAVENOUS | Status: AC
  Administered 2024-05-31: 200 mg via INTRAVENOUS
  Filled 2024-05-31: qty 10

## 2024-05-31 NOTE — Progress Notes (Signed)
 Diagnosis: Iron  Deficiency Anemia  Provider:  Praveen Mannam MD  Procedure: IV Push  IV Type: Peripheral, IV Location: L Antecubital  Venofer  (Iron  Sucrose), Dose: 200 mg  Post Infusion IV Care: Observation period completed and Peripheral IV Discontinued  Discharge: Condition: Good, Destination: Home . AVS Declined  Performed by:  Leita FORBES Miles, LPN

## 2024-06-02 ENCOUNTER — Ambulatory Visit (INDEPENDENT_AMBULATORY_CARE_PROVIDER_SITE_OTHER)

## 2024-06-02 VITALS — BP 123/77 | HR 93 | Temp 98.3°F | Resp 16 | Ht 59.0 in | Wt 168.6 lb

## 2024-06-02 DIAGNOSIS — O99013 Anemia complicating pregnancy, third trimester: Secondary | ICD-10-CM | POA: Diagnosis not present

## 2024-06-02 DIAGNOSIS — Z3A30 30 weeks gestation of pregnancy: Secondary | ICD-10-CM | POA: Diagnosis not present

## 2024-06-02 MED ORDER — IRON SUCROSE 20 MG/ML IV SOLN
200.0000 mg | Freq: Once | INTRAVENOUS | Status: AC
  Administered 2024-06-02: 200 mg via INTRAVENOUS
  Filled 2024-06-02: qty 10

## 2024-06-02 NOTE — Progress Notes (Signed)
 Diagnosis: Iron  Deficiency Anemia  Provider:  Praveen Mannam MD  Procedure: IV Push  IV Type: Peripheral, IV Location: L Antecubital  Venofer  (Iron  Sucrose), Dose: 200 mg  Post Infusion IV Care: Observation period completed and Peripheral IV Discontinued  Discharge: Condition: Good, Destination: Home . AVS Declined  Performed by:  Maximiano JONELLE Pouch, LPN

## 2024-06-05 ENCOUNTER — Encounter: Admitting: Obstetrics and Gynecology

## 2024-06-05 ENCOUNTER — Ambulatory Visit: Payer: Self-pay | Admitting: Obstetrics and Gynecology

## 2024-06-19 ENCOUNTER — Ambulatory Visit: Admitting: Obstetrics & Gynecology

## 2024-06-19 ENCOUNTER — Other Ambulatory Visit: Payer: Self-pay

## 2024-06-19 ENCOUNTER — Encounter: Payer: Self-pay | Admitting: Obstetrics & Gynecology

## 2024-06-19 VITALS — BP 120/73 | HR 98 | Wt 167.7 lb

## 2024-06-19 DIAGNOSIS — O133 Gestational [pregnancy-induced] hypertension without significant proteinuria, third trimester: Secondary | ICD-10-CM | POA: Diagnosis not present

## 2024-06-19 DIAGNOSIS — O0993 Supervision of high risk pregnancy, unspecified, third trimester: Secondary | ICD-10-CM

## 2024-06-19 DIAGNOSIS — O24410 Gestational diabetes mellitus in pregnancy, diet controlled: Secondary | ICD-10-CM | POA: Diagnosis not present

## 2024-06-19 DIAGNOSIS — O34219 Maternal care for unspecified type scar from previous cesarean delivery: Secondary | ICD-10-CM | POA: Diagnosis not present

## 2024-06-19 DIAGNOSIS — Z3A32 32 weeks gestation of pregnancy: Secondary | ICD-10-CM | POA: Diagnosis not present

## 2024-06-19 LAB — GLUCOSE, CAPILLARY: Glucose-Capillary: 110 mg/dL — ABNORMAL HIGH (ref 70–99)

## 2024-06-19 NOTE — Progress Notes (Signed)
 PRENATAL VISIT NOTE  Subjective:  April Strong is a 28 y.o. G3P1011 at [redacted]w[redacted]d being seen today for ongoing prenatal care.  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  Patient reports having some pain underneath her ribs on the left side related to baby's movement.  Contractions: Not present. Vag. Bleeding: None.  Movement: Present. Denies leaking of fluid.   The following portions of the patient's history were reviewed and updated as appropriate: allergies, current medications, past family history, past medical history, past social history, past surgical history and problem list.   Objective:   Vitals:   06/19/24 1112  BP: 120/73  Pulse: 98  Weight: 167 lb 11.2 oz (76.1 kg)    Fetal Status:  Fetal Heart Rate (bpm): 150   Movement: Present    General: Alert, oriented and cooperative. Patient is in no acute distress.  Skin: Skin is warm and dry. No rash noted.   Cardiovascular: Normal heart rate noted  Respiratory: Normal respiratory effort, no problems with respiration noted  Abdomen: Soft, gravid, appropriate for gestational age.  Pain/Pressure: Present     Pelvic: Cervical exam deferred        Extremities: Normal range of motion.  Edema: None  Mental Status: Normal mood and affect. Normal behavior. Normal judgment and thought content.      04/19/2024   11:14 AM 01/25/2024    9:33 AM  Depression screen PHQ 2/9  Decreased Interest 0 1  Down, Depressed, Hopeless 0 1  PHQ - 2 Score 0 2  Altered sleeping  1  Tired,  decreased energy  2  Change in appetite  0  Feeling bad or failure about yourself   0  Trouble concentrating  0  Moving slowly or fidgety/restless  0  Suicidal thoughts  0  PHQ-9 Score  5      Data saved with a previous flowsheet row definition        01/25/2024    9:33 AM  GAD 7 : Generalized Anxiety Score  Nervous, Anxious, on Edge 0  Control/stop worrying 0  Worry too much - different things 0  Trouble relaxing 0  Restless 0  Easily annoyed or irritable 0  Afraid - awful might happen 0  Total GAD 7 Score 0    Results for orders placed or performed in visit on 06/19/24 (from the past 24 hours)  Glucose, capillary     Status: Abnormal   Collection Time: 06/19/24 11:29 AM  Result Value Ref Range   Glucose-Capillary 110 (H) 70 - 99 mg/dL    Assessment and Plan:  Pregnancy: G3P1011 at [redacted]w[redacted]d 1. Diet controlled gestational diabetes mellitus (GDM) in third trimester (Primary) Did not bring log, reports sugars within range.  Random CBG 110, she reported eating bagel with bacon and eggs and orange juice about 3 hours before appointment. Emphasized adherence to diabetic diet, encouraged to bring log next visit. Has growth scan on 06/22/24, last EFW was 45%  2. Hx of cesarean section complicating pregnancy Already consented for TOLAC on 05/30/24  3. Hx of Gestational hypertension Stable BP, on ASA  4. [redacted] weeks gestation of pregnancy 5. Supervision  of high risk pregnancy in third trimester Counseled about RSV vaccine, information given to patient. Preterm labor symptoms and general obstetric precautions including but not limited to vaginal bleeding, contractions, leaking of fluid and fetal movement were reviewed in detail with the patient. Please refer to After Visit Summary for other counseling recommendations.   Return in about 2 weeks (around 07/03/2024) for OFFICE OB VISIT (MD or APP).  Future Appointments  Date Time Provider Department Center  06/22/2024  9:15 AM [facility]  PROVIDER 1 [facility]  06/22/2024  9:30 AM [facility] US5 [facility]  07/03/2024 11:15 AM Cleatus Moccasin, MD [facility]  07/18/2024  8:15 AM Jomarie Charlie LABOR, MD [facility]  07/18/2024  9:15 AM [facility] PROVIDER 1 [facility]  07/18/2024  9:30 AM [facility] US1 [facility]  07/25/2024  9:55 AM Izell Harari, MD [facility]  07/31/2024 11:15 AM Herchel, Gloris LABOR, MD [facility]  08/07/2024 11:15 AM Fredirick Glenys RAMAN, MD [facility]  08/14/2024 11:15 AM Cleatus Moccasin, MD [facility]    Gloris Herchel, MD

## 2024-06-19 NOTE — Patient Instructions (Signed)
 Return to office for any scheduled appointments. Call the office or go to the MAU at [facility] if: You begin to have strong, frequent contractions Your water breaks.  Sometimes it is a big gush of fluid, sometimes it is just a trickle that keeps getting your underwear wet or running down your legs You have vaginal bleeding.  It is normal to have a small amount of spotting if your cervix was checked.  You do not feel your baby moving like normal.  If you do not, get something to eat and drink and lay down and focus on feeling your baby move.   If your baby is still not moving like normal, you should call the office or go to MAU. Any other obstetric concerns.     RSV Vaccination for Pregnant People  CDC recommends two ways to protect babies from getting very sick with Respiratory Syncytial Virus (RSV):  An RSV vaccination given during pregnancy  Pfizer's vaccine Verdis Frederickson) is recommended for use during pregnancy. It is given during RSV season to people who are 32 through [redacted] weeks pregnant.  Or, An RSV immunization given directly to infants and some older babies  Babies born to mothers who get RSV vaccine at least 2 weeks before delivery will have protection and, in most cases, should not need an RSV immunization later.    When is RSV season?  In most regions of the [place] RSV season starts in the fall and peaks in the winter, but the timing and severity of RSV season can vary from place to place and year to year.   The goal of maternal RSV vaccination is to protect babies from getting very sick with RSV during their first RSV season.  In most of the [place], this means maternal RSV vaccine will be given in September through January.  Who should get the maternal RSV vaccine?  People who are 79 through [redacted] weeks pregnant during September through January should get one dose of maternal RSV vaccine to protect their babies. RSV season  can vary around the country.   How is the maternal RSV vaccine administered?  Maternal RSV vaccine is given as a shot into the mother's upper arm. Only a single dose (one shot) of maternal RSV vaccine is recommended.   It is not yet known whether another dose might be needed in later pregnancies.  How well does the maternal RSV vaccine work?  When someone gets RSV vaccine, their body responds by making a protein that protects against the virus that causes RSV. The process takes about 2 weeks. When a pregnant person gets RSV vaccine, their protective proteins (called antibodies) also pass to their baby. So, babies who are born at least 2 weeks after their mother gets RSV vaccine are protected at birth, when infants are at the highest risk of severe RSV disease.   The vaccine can reduce a baby's risk of being hospitalized from RSV by 57% in the first six months after birth.  What are the possible side effects of the maternal RSV vaccine?  In the clinical trials, the side effects most often reported by pregnant people who received the maternal RSV vaccine were pain at the injection site, headache, muscle pain, and nausea.  Although not common, a dangerous high blood pressure condition called pre-eclampsia occurred in 1.8% of pregnant people who received the maternal RSV vaccine compared to 1.4% of pregnant people who received a placebo.  The clinical  trials identified a small increase in the number of preterm births in vaccinated pregnant people. It is not clear if this is a true safety problem related to RSV vaccine or if this occurred for reasons unrelated to vaccination.  To reduce the potential risk of preterm birth and complications from RSV disease, FDA approved the maternal RSV vaccine for use during weeks 32 through 30 of pregnancy while additional studies are conducted.  FDA is requiring the manufacturer to do additional studies that will look more closely at the potential risk of  preterm births and pregnancy-related high blood pressure issues in mothers, including pre-eclampsia.  Severe allergic reactions to vaccines are rare but can happen after any vaccine and can be life-threatening. If you see signs of a severe allergic reaction after vaccination (hives, swelling of the face and throat, difficulty breathing, a fast heartbeat, dizziness, or weakness), seek immediate medical care by calling 911.  As with any medicine or vaccine there is a very remote chance of the vaccine causing other serious injury or death after vaccination.  Adverse events following vaccination should be reported to the Vaccine Adverse Event Reporting System (VAERS), even if it's not clear that the vaccine caused the adverse event. You or your doctor can report an adverse event to [employer] and FDA through VAERS. If you need further assistance reporting to VAERS, please email [email or web address] [email or web address] or call [phone].  If you have any questions about side effects from the maternal RSV vaccine, talk with your healthcare provider.  Do I need a prescription for a maternal RSV vaccine?  Until the vaccine available in the office, you will need a prescription to take to a local pharmacy that is providing the vaccine.   How do I pay for the maternal RSV vaccine?  Most private health insurance plans cover the maternal RSV vaccine, but there may be a cost to you depending on your plan.  Contact your insurer to find out.  Medicaid Beginning April 19, 2022, most people with coverage from [facility] and [employer] [facility]) will be guaranteed coverage of all vaccines recommended by the Advisory Committee on Immunization Practice at no cost to them.   Source: [facility] for Immunization and Respiratory Diseases

## 2024-06-20 ENCOUNTER — Encounter: Admitting: Obstetrics & Gynecology

## 2024-06-22 ENCOUNTER — Ambulatory Visit: Attending: Obstetrics and Gynecology

## 2024-06-22 ENCOUNTER — Ambulatory Visit

## 2024-06-22 VITALS — BP 127/64 | HR 91

## 2024-06-22 DIAGNOSIS — O24410 Gestational diabetes mellitus in pregnancy, diet controlled: Secondary | ICD-10-CM

## 2024-06-22 DIAGNOSIS — O321XX0 Maternal care for breech presentation, not applicable or unspecified: Secondary | ICD-10-CM

## 2024-06-22 DIAGNOSIS — O09293 Supervision of pregnancy with other poor reproductive or obstetric history, third trimester: Secondary | ICD-10-CM | POA: Diagnosis not present

## 2024-06-22 DIAGNOSIS — Z3A33 33 weeks gestation of pregnancy: Secondary | ICD-10-CM

## 2024-06-22 DIAGNOSIS — O99013 Anemia complicating pregnancy, third trimester: Secondary | ICD-10-CM

## 2024-06-22 DIAGNOSIS — O34219 Maternal care for unspecified type scar from previous cesarean delivery: Secondary | ICD-10-CM | POA: Diagnosis not present

## 2024-06-22 DIAGNOSIS — O365931 Maternal care for other known or suspected poor fetal growth, third trimester, fetus 1: Secondary | ICD-10-CM

## 2024-06-22 NOTE — Progress Notes (Signed)
 Patient information  Patient Name: April Strong  Patient MRN:   [id number]  Referring practice: MFM Referring Provider: [facility])  Problem List   Patient Active Problem List   Diagnosis Date Noted   Gestational diabetes mellitus in pregnancy, diet controlled 03/02/2024   Cervical high risk HPV (human papillomavirus) test positive 02/22/2024   Anemia affecting pregnancy 01/27/2024   Prediabetes in mother during pregnancy 01/27/2024   Rubella non-immune status, antepartum 01/27/2024   Hx of cesarean section complicating pregnancy 01/25/2024   Supervision of high-risk pregnancy 01/18/2024   Hx of Gestational hypertension 05/25/2019   Hx of IUGR (intrauterine growth restriction) 05/23/2019    Maternal Fetal medicine Consult  April Strong is a 28 y.o. G3P1011 at [redacted]w[redacted]d here for ultrasound and consultation. April Strong is doing well today with no acute concerns. Today we focused on the following:   The patient is here due to gestational diabetes that is diet-controlled.  She reports that her blood sugars are nearly all at goal.  She has a history of a fetal growth restriction and a previous pregnancy of the baby that weighed 5 pounds 12 ounces at 39 weeks that was delivered via C-section.  I discussed that today the fetal growth is normal at the 26 percentile with normal amniotic fluid.  At approximately 39 weeks at this growth percentile this would result in a 6 pound 15 ounce baby.We discussed once again the importance of proper glycemic control and monitoring fetal movement.  She reports good fetal movement today.  The fetus is currently breech presentation.  Will bring the patient back in 4 weeks to assess for presentation.  She is interested in an external cephalic version and trial of labor after cesarean birth.  She will discuss the risks versus benefits with her OB provider.  She has anemia but is getting iron  infusions.  Her last  hemoglobin value was 8.6.  The patient had time to ask questions that were answered to her satisfaction.  She verbalized understanding and agrees to proceed with the plan below.  Standard OB precautions were given to the patient when appropriate based on the gestational age (fetal kick counts, importance of attending prenatal visits, any antenatal testing or ultrasounds that are scheduled as well as monitoring for signs and symptoms that is labor, vaginal bleeding, loss of amniotic fluid, or decreased fetal movement).  Recommendations - Growth ultrasound in 4 weeks.  Presentation will be assessed at that time as well.  If still breech we will coordinate external cephalic version with the OB provider if the patient still desires this after she has been counseled by her OB provider. -Continue iron  infusions with OB provider   I spent 30 minutes reviewing the patients chart, including labs and images as well as counseling the patient about her medical conditions. Greater than 50% of the time was spent in direct face-to-face patient counseling.  Delora Smaller  MFM, [place]   06/22/2024  11:44 AM   Review of Systems: A review of systems was performed and was negative except per HPI   Vitals and Physical Exam    06/22/2024    9:53 AM 06/19/2024   11:12 AM 06/02/2024    9:18 AM  Vitals with BMI  Weight  167 lbs 11 oz   BMI  33.85   Systolic 127 120 876  Diastolic 64 73 77  Pulse 91 98 93    Sitting comfortably on the sonogram  table Nonlabored breathing Normal rate and rhythm Abdomen is nontender  Past pregnancies OB History  Gravida Para Term Preterm AB Living  3 1 1  0 1 1  SAB IAB Ectopic Multiple Live Births  1 0 0 0 1    # Outcome Date GA Lbr Len/2nd Weight Sex Type Anes PTL Lv  3 Current           2 SAB 03/2022          1 Term 05/25/19 [redacted]w[redacted]d  5 lb 12.2 oz (2.615 kg) M CS-LTranv EPI  LIV     Future Appointments  Date Time Provider Department Center  07/03/2024 11:15 AM  Cleatus Moccasin, MD [facility]  07/18/2024  9:15 AM [facility] PROVIDER 1 [facility]  07/18/2024  9:30 AM [facility] US1 [facility]  07/18/2024 10:55 AM Jomarie Charlie LABOR, MD [facility]  07/25/2024 10:55 AM Izell Harari, MD [facility]  07/31/2024 11:15 AM Herchel, Gloris LABOR, MD [facility]  08/07/2024 11:15 AM Fredirick Glenys RAMAN, MD [facility]  08/14/2024 11:15 AM Cleatus Moccasin, MD [facility]

## 2024-06-22 NOTE — Progress Notes (Unsigned)
 na

## 2024-07-03 ENCOUNTER — Encounter: Admitting: Obstetrics and Gynecology

## 2024-07-03 ENCOUNTER — Telehealth (INDEPENDENT_AMBULATORY_CARE_PROVIDER_SITE_OTHER): Admitting: Obstetrics and Gynecology

## 2024-07-03 DIAGNOSIS — Z2839 Other underimmunization status: Secondary | ICD-10-CM

## 2024-07-03 DIAGNOSIS — D649 Anemia, unspecified: Secondary | ICD-10-CM

## 2024-07-03 DIAGNOSIS — Z3A34 34 weeks gestation of pregnancy: Secondary | ICD-10-CM | POA: Diagnosis not present

## 2024-07-03 DIAGNOSIS — O34219 Maternal care for unspecified type scar from previous cesarean delivery: Secondary | ICD-10-CM | POA: Diagnosis not present

## 2024-07-03 DIAGNOSIS — O0993 Supervision of high risk pregnancy, unspecified, third trimester: Secondary | ICD-10-CM

## 2024-07-03 DIAGNOSIS — O09293 Supervision of pregnancy with other poor reproductive or obstetric history, third trimester: Secondary | ICD-10-CM | POA: Diagnosis not present

## 2024-07-03 DIAGNOSIS — O365931 Maternal care for other known or suspected poor fetal growth, third trimester, fetus 1: Secondary | ICD-10-CM

## 2024-07-03 DIAGNOSIS — O321XX0 Maternal care for breech presentation, not applicable or unspecified: Secondary | ICD-10-CM

## 2024-07-03 DIAGNOSIS — O133 Gestational [pregnancy-induced] hypertension without significant proteinuria, third trimester: Secondary | ICD-10-CM

## 2024-07-03 DIAGNOSIS — O99013 Anemia complicating pregnancy, third trimester: Secondary | ICD-10-CM

## 2024-07-03 DIAGNOSIS — O24410 Gestational diabetes mellitus in pregnancy, diet controlled: Secondary | ICD-10-CM

## 2024-07-03 DIAGNOSIS — O99810 Abnormal glucose complicating pregnancy: Secondary | ICD-10-CM

## 2024-07-03 MED ORDER — FAMOTIDINE 20 MG PO TABS: 20.0000 mg | ORAL_TABLET | Freq: Every day | ORAL | 0 refills | Status: AC

## 2024-07-03 NOTE — Progress Notes (Signed)
 Pt will put BP in My Chart later today

## 2024-07-03 NOTE — Progress Notes (Signed)
 OBSTETRICS PRENATAL VIRTUAL VISIT ENCOUNTER NOTE  Provider location: Center for [facility]   Patient location: Home  I connected with April Strong on 07/03/2024 at 11:15 AM EST by MyChart Video Encounter and verified that I am speaking with the correct person using two identifiers. I discussed the limitations, risks, security and privacy concerns of performing an evaluation and management service virtually and the availability of in person appointments. I also discussed with the patient that there may be a patient responsible charge related to this service. The patient expressed understanding and agreed to proceed. Subjective:  April Strong is a 28 y.o. G3P1011 at [redacted]w[redacted]d being seen today for ongoing prenatal care.  She is currently monitored for the following issues for this high-risk pregnancy and has Hx of IUGR (intrauterine growth restriction); Hx of Gestational hypertension; Supervision of high-risk pregnancy; Hx of cesarean section complicating pregnancy; Anemia affecting pregnancy; Prediabetes in mother during pregnancy; Rubella non-immune status, antepartum; Cervical high risk HPV (human papillomavirus) test positive; Gestational diabetes mellitus in pregnancy, diet controlled; and Breech presentation of fetus on their problem list.  Patient reports no complaints except some acid reflex in her sleep.   . Vag. Bleeding: None.  Movement: Present. Denies any leaking of fluid.   The following portions of the patient's history were reviewed and updated as appropriate: allergies, current medications, past family history, past medical history, past social history, past surgical history and problem list.   Objective:    There were no vitals filed for this visit.  Fetal Status:      Movement: Present    General: Alert, oriented and cooperative. Patient is in no acute distress.  Respiratory: Normal respiratory effort, no problems with respiration  noted  Mental Status: Normal mood and affect. Normal behavior. Normal judgment and thought content.  Rest of physical exam deferred due to type of encounter  Imaging: US  MFM OB FOLLOW UP Result Date: 06/22/2024 ----------------------------------------------------------------------  OBSTETRICS REPORT                       (Signed Final 06/22/2024 11:48 am) ---------------------------------------------------------------------- Patient Info  ID #:       [id number]                          D.O.B.:  06-23-1996 (28 yrs)(F)  Name:       April Strong             Visit Date: 06/22/2024 11:28 am ---------------------------------------------------------------------- Performed By  Attending:        Delora Smaller DO       Ref. Address:     [address]                                                             [place], [place]                                                             [address]  Performed By:     Delora Smaller  DO       [facility]  Referred By:      [facility] ---------------------------------------------------------------------- Orders  #  Description                           Code        Ordered By  1  US  MFM OB FOLLOW UP                   [id number]    DELORA SMALLER ----------------------------------------------------------------------  #  Order #                     Accession #                Episode #  1  [id number]                   [id number]                 [id number] ---------------------------------------------------------------------- Indications  Breech presentation                            O32.1XX0  [redacted] weeks gestation of pregnancy                Z3A.33  Gestational diabetes in pregnancy, diet        O24.410  controlled   Obesity complicating pregnancy, second         O99.212  trimester (BMI 32)  Uterine fibroids affecting pregnancy in        O34.12, D25.9  second trimester, antepartum  Poor obstetric history: Previous               O09.299  preeclampsia / eclampsia/gestational HTN  Previous cesarean delivery, antepartum         O34.219  Anemia during pregnancy in second trimester    O99.012 ---------------------------------------------------------------------- Fetal Evaluation  Num Of Fetuses:         1  Fetal Heart Rate(bpm):  155  Cardiac Activity:       Observed  Presentation:           Breech  Placenta:               Fundal  P. Cord Insertion:      Previously seen  Amniotic Fluid  AFI FV:      Within normal limits  AFI Sum(cm)     %Tile       Largest Pocket(cm)  10.69           23          4.32  RUQ(cm)       RLQ(cm)       LUQ(cm)        LLQ(cm)  4.32          0             4.14           2.23 ---------------------------------------------------------------------- Biometry  BPD:      78.7  mm     G. Age:  31w 4d         10  %    CI:        72.96   %    70 - 86                                                          FL/HC:      21.5   %    19.9 - 21.5  HC:      292.9  mm     G. Age:  32w 2d          6  %    HC/AC:      1.02        0.96 - 1.11  AC:      286.6  mm     G. Age:  32w 5d         41  %    FL/BPD:     79.9   %    71 - 87  FL:       62.9  mm     G. Age:  32w 4d         26  %    FL/AC:      21.9   %    20 - 24  LV:        4.1  mm  Est. FW:    1988  gm      4 lb 6 oz     26  %  Est. FW at 39 Wks:       3158  gm    6 lb 15 oz ---------------------------------------------------------------------- OB History  Blood Type:   O+  Gravidity:    3         Term:   1         SAB:   1  Living:       1 ---------------------------------------------------------------------- Gestational Age  LMP:           31w 6d        Date:  11/12/23                  EDD:   08/18/24  U/S Today:     32w 2d  EDD:    08/15/24  Best:          33w 0d     Det. By:  Early Ultrasound         EDD:   08/10/24                                      (01/25/24) ---------------------------------------------------------------------- Comments  Sonographic findings  Single intrauterine pregnancy at 33w 0d.  Fetal cardiac activity:  Observed and appears normal.  Presentation: Breech.  Interval fetal anatomy appears normal.  Fetal biometry shows the estimated fetal weight of 4 lb 6 oz,  1988g (26%).  Amniotic fluid volume: Within normal limits. AFI: 10.69cm.  MVP: 4.32 cm.  Placenta: Fundal.  There are limitations of prenatal ultrasound such as the  inability to detect certain abnormalities due to poor  visualization. Various factors such as fetal position,  gestational age and maternal body habitus may increase the  difficulty in visualizing the fetal anatomy.  Recommendations  - See Epic note for assessment and plan of care. Any  referring office that does not utilize Epic will recieve a copy of  today's consult note via fax. Please contact our office with  any concerns. ----------------------------------------------------------------------                  Delora Smaller, DO Electronically Signed Final Report   06/22/2024 11:48 am ----------------------------------------------------------------------    Assessment and Plan:  Pregnancy: G3P1011 at [redacted]w[redacted]d 1. Supervision of high risk pregnancy in third trimester (Primary) Offer RSV vaccine next visit. Counseling done last time.  GBS next visit.  Pepcid  at bedtime if needed.  2. Pregnancy with 34 completed weeks gestation  3. Hx of Gestational hypertension Not with her BP cuff. She will get bbsx app again and then put BP in there. New invite sent.  Taking LdASA  4. Diet controlled gestational diabetes mellitus (GDM) in third trimester Current regimen: Diet CBG review: Fastings - 90s, Meals - 120s, occasional 130 Growth US : See below Antenatal monitoring: Not indicated Other needs:  None  5. Breech presentation, single or unspecified fetus Recheck on next growth.   6. Anemia affecting pregnancy in third trimester Virtual visit today so check labs next visit.   7. Hx of cesarean section complicating pregnancy TOLAC consent completed  8. Hx of IUGR (intrauterine growth restriction) Growth on 12/4 was normal, 26%ile.  Next US  is 12/30.   9. Prediabetes in mother during pregnancy See above. Recheck A1C next visit.   10. Rubella non-immune status, antepartum MMR after delivery  Preterm labor symptoms and general obstetric precautions including but not limited to vaginal bleeding, contractions, leaking of fluid and fetal movement were reviewed in detail with the patient. I discussed the assessment and treatment plan with the patient. The patient was provided an opportunity to ask questions and all were answered. The patient agreed with the plan and demonstrated an understanding of the instructions. The patient was advised to call back or seek an in-person office evaluation/go to MAU at [facility] for any urgent or concerning symptoms. Please refer to After Visit Summary for other counseling recommendations.   I provided 7 minutes of face-to-face time during this encounter.  No follow-ups on file.  Future Appointments  Date Time Provider Department Center  07/18/2024  9:15 AM [facility] PROVIDER 1 [facility]  07/18/2024  9:30 AM [facility] US1 [facility]  07/18/2024  10:55 AM Jomarie Charlie LABOR, MD [facility]  07/25/2024 10:55 AM Izell Harari, MD [facility]  07/31/2024 11:15 AM Herchel, Gloris LABOR, MD [facility]  08/07/2024 11:15 AM Fredirick Glenys RAMAN, MD [facility]  08/14/2024 11:15 AM Cleatus Moccasin, MD [facility]    Moccasin Cleatus, MD Center for [facility]

## 2024-07-18 ENCOUNTER — Ambulatory Visit: Attending: Obstetrics and Gynecology

## 2024-07-18 ENCOUNTER — Ambulatory Visit

## 2024-07-18 ENCOUNTER — Encounter

## 2024-07-18 ENCOUNTER — Other Ambulatory Visit

## 2024-07-18 ENCOUNTER — Encounter: Admitting: Obstetrics and Gynecology

## 2024-07-25 ENCOUNTER — Encounter: Admitting: Obstetrics and Gynecology

## 2024-07-31 ENCOUNTER — Encounter: Admitting: Obstetrics & Gynecology

## 2024-08-07 ENCOUNTER — Encounter: Admitting: Family Medicine

## 2024-08-14 ENCOUNTER — Encounter: Admitting: Obstetrics and Gynecology
